# Patient Record
Sex: Male | Born: 1980 | Race: White | Hispanic: No | Marital: Single | Smoking: Current every day smoker
Health system: Southern US, Community
[De-identification: ages and names within clinical notes are randomized; demographics above are authoritative.]

## PROBLEM LIST (undated history)

## (undated) DIAGNOSIS — L02519 Cutaneous abscess of unspecified hand: Secondary | ICD-10-CM

## (undated) DIAGNOSIS — F419 Anxiety disorder, unspecified: Secondary | ICD-10-CM

## (undated) DIAGNOSIS — L03119 Cellulitis of unspecified part of limb: Secondary | ICD-10-CM

## (undated) DIAGNOSIS — S0990XA Unspecified injury of head, initial encounter: Secondary | ICD-10-CM

## (undated) DIAGNOSIS — F209 Schizophrenia, unspecified: Secondary | ICD-10-CM

## (undated) HISTORY — PX: ADENOIDECTOMY: SUR15

## (undated) HISTORY — PX: OTHER SURGICAL HISTORY: SHX169

## (undated) HISTORY — PX: TYMPANOSTOMY TUBE PLACEMENT: SHX32

---

## 2002-06-20 ENCOUNTER — Encounter: Payer: Self-pay | Admitting: Emergency Medicine

## 2002-06-20 ENCOUNTER — Inpatient Hospital Stay (HOSPITAL_COMMUNITY): Admission: EM | Admit: 2002-06-20 | Discharge: 2002-06-21 | Payer: Self-pay | Admitting: Neurosurgery

## 2002-09-27 ENCOUNTER — Emergency Department (HOSPITAL_COMMUNITY): Admission: EM | Admit: 2002-09-27 | Discharge: 2002-09-28 | Payer: Self-pay | Admitting: Emergency Medicine

## 2003-03-14 ENCOUNTER — Emergency Department (HOSPITAL_COMMUNITY): Admission: EM | Admit: 2003-03-14 | Discharge: 2003-03-14 | Payer: Self-pay | Admitting: Emergency Medicine

## 2003-12-28 ENCOUNTER — Inpatient Hospital Stay (HOSPITAL_COMMUNITY): Admission: EM | Admit: 2003-12-28 | Discharge: 2004-01-04 | Payer: Self-pay | Admitting: Psychiatry

## 2003-12-30 ENCOUNTER — Encounter: Payer: Self-pay | Admitting: Psychiatry

## 2004-01-29 ENCOUNTER — Emergency Department (HOSPITAL_COMMUNITY): Admission: EM | Admit: 2004-01-29 | Discharge: 2004-01-29 | Payer: Self-pay | Admitting: Emergency Medicine

## 2004-10-02 ENCOUNTER — Emergency Department (HOSPITAL_COMMUNITY): Admission: EM | Admit: 2004-10-02 | Discharge: 2004-10-03 | Payer: Self-pay | Admitting: Emergency Medicine

## 2007-06-05 ENCOUNTER — Emergency Department (HOSPITAL_COMMUNITY): Admission: EM | Admit: 2007-06-05 | Discharge: 2007-06-05 | Payer: Self-pay | Admitting: Emergency Medicine

## 2009-10-01 ENCOUNTER — Emergency Department (HOSPITAL_COMMUNITY): Admission: EM | Admit: 2009-10-01 | Discharge: 2009-10-01 | Payer: Self-pay | Admitting: Emergency Medicine

## 2009-10-05 ENCOUNTER — Emergency Department (HOSPITAL_COMMUNITY): Admission: EM | Admit: 2009-10-05 | Discharge: 2009-10-05 | Payer: Self-pay | Admitting: Emergency Medicine

## 2011-01-15 LAB — RAPID STREP SCREEN (MED CTR MEBANE ONLY): Streptococcus, Group A Screen (Direct): NEGATIVE
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
## 2011-03-02 NOTE — Discharge Summary (Signed)
NAME:  Troy [name], Troy [name]                           ACCOUNT NO.:  [id number]   MEDICAL RECORD NO.:  [id number]                   PATIENT TYPE:  IPS   LOCATION:  0405                                 FACILITY:  [facility]   PHYSICIAN:  Jeanice Lim, M.D.              DATE OF BIRTH:  19-Oct-1980   DATE OF ADMISSION:  12/28/2003  DATE OF DISCHARGE:  01/04/2004                                 DISCHARGE SUMMARY   IDENTIFYING DATA:  This is a 30 year old Caucasian male, single,  involuntarily admitted with history of taking an ecstasy pill and had  command auditory hallucinations telling him to eat his feces, which he  followed.  He felt the feces or fecal matter, due to being orangish and  clear, was still a pure form of ecstasy and therefore he should eat it to  continue his euphoria.  The patient drinks beer for calories because he is  fasting, feeling that he should not eat.  Quite delusional at the time of  admission.  Long history of psychotic disorder as well as substance abuse  history.   MEDICATIONS:  Risperdal, Consta 37.5 mg (last dose on December 21, 2003).   ALLERGIES:  No known drug allergies.   PHYSICAL EXAMINATION:  Within normal limits.  Neurologically nonfocal.   LABORATORY DATA:  Routine admission labs within normal limits.   MENTAL STATUS EXAM:  Fully alert with stocking cap on head, pulled low over  eyes.  Sweatsuit.  Speech mumbling.  Difficult to understand the patient,  garbled, guarded, withdrawn, isolated, paranoid, distorted thinking,  decreased volition.  Positive suicidal ideation without plan.  Cognitively  intact.  Judgment and insight are poor.  The patient reported he is fasting.  Only drinking milk, lots of tea, coffee and beer.   ADMISSION DIAGNOSES:   AXIS I:  1. Schizophrenia, undifferentiated-type.  2. Polysubstance abuse.  3. Rule out substance-induced psychotic disorder.   AXIS II:  Deferred.   AXIS III:  None.   AXIS IV:  Moderate (problems with  primary support group).   AXIS V:  30/55.   HOSPITAL COURSE:  The patient was admitted and ordered routine p.r.n.  medications and underwent further monitoring.  Was encouraged to participate  in individual, group and milieu therapy.  The patient was resumed on  psychotropics and Risperdal Consta was ordered.  The next shot would have  been due due to severity of psychosis.  The patient described CD symptoms as  well as egosyntonic and egodystonic psychotic symptoms consistent with  schizophrenia.  The patient was optimized on p.o. Risperdal .  The patient  had scattered thoughts and some delusional thinking and some out-of-  __________ ideas.  No EPS or TD was noted.  The patient was optimized on  Depakote and Risperdal and showed clear improvement with a decrease in overt  psychotic symptoms.  Stabilization of mood.  No dangerous ideation.  Good  reality testing as far as interacting with environment.   CONDITION ON DISCHARGE:  The patient was discharged with no risk issues  after medication education.   DISCHARGE MEDICATIONS:  1. Cogentin 1 mg b.i.d.  2. Risperdal 1/2 q.a.m., 1/2 at 4 p.m., 2 q.h.s.  3. Depakote 500 mg q.h.s.  4. Trazodone 100 mg p.r.n. insomnia.  5. Risperdal Consta 50 mg every two weeks.  The patient had Risperdal Consta     on January 04, 2004 at 10:30 a.m.  Next due is April 2005.   FOLLOW UP:  The patient was to follow up with [facility] for intake within five days.   DISCHARGE DIAGNOSES:   AXIS I:  1. Schizophrenia, undifferentiated-type.  2. Polysubstance abuse.  3. Rule out substance-induced psychotic disorder.   AXIS II:  Deferred.   AXIS III:  None.   AXIS IV:  Moderate (problems with primary support group).   AXIS V:  Global Assessment of Functioning on discharge 50-55.                                               Jeanice Lim, M.D.    JEM/MEDQ  D:  02/22/2004  T:  02/23/2004  Job:  [id number]

## 2012-03-05 ENCOUNTER — Encounter (HOSPITAL_COMMUNITY): Payer: Self-pay | Admitting: *Deleted

## 2012-03-05 ENCOUNTER — Emergency Department (HOSPITAL_COMMUNITY): Payer: Medicaid Other

## 2012-03-05 ENCOUNTER — Emergency Department (HOSPITAL_COMMUNITY)
Admission: EM | Admit: 2012-03-05 | Discharge: 2012-03-05 | Disposition: A | Discharge status: Home / Self Care | Payer: Medicaid Other | Attending: Emergency Medicine | Admitting: Emergency Medicine

## 2012-03-05 DIAGNOSIS — M25519 Pain in unspecified shoulder: Secondary | ICD-10-CM | POA: Insufficient documentation

## 2012-03-05 DIAGNOSIS — R079 Chest pain, unspecified: Secondary | ICD-10-CM | POA: Insufficient documentation

## 2012-03-05 DIAGNOSIS — Z8659 Personal history of other mental and behavioral disorders: Secondary | ICD-10-CM | POA: Insufficient documentation

## 2012-03-05 DIAGNOSIS — R1011 Right upper quadrant pain: Secondary | ICD-10-CM | POA: Insufficient documentation

## 2012-03-05 DIAGNOSIS — H5789 Other specified disorders of eye and adnexa: Secondary | ICD-10-CM | POA: Insufficient documentation

## 2012-03-05 DIAGNOSIS — S298XXA Other specified injuries of thorax, initial encounter: Secondary | ICD-10-CM

## 2012-03-05 DIAGNOSIS — R209 Unspecified disturbances of skin sensation: Secondary | ICD-10-CM | POA: Insufficient documentation

## 2012-03-05 DIAGNOSIS — S0510XA Contusion of eyeball and orbital tissues, unspecified eye, initial encounter: Secondary | ICD-10-CM | POA: Insufficient documentation

## 2012-03-05 DIAGNOSIS — R51 Headache: Secondary | ICD-10-CM | POA: Insufficient documentation

## 2012-03-05 DIAGNOSIS — R42 Dizziness and giddiness: Secondary | ICD-10-CM | POA: Insufficient documentation

## 2012-03-05 DIAGNOSIS — S0180XA Unspecified open wound of other part of head, initial encounter: Secondary | ICD-10-CM | POA: Insufficient documentation

## 2012-03-05 DIAGNOSIS — S060X0A Concussion without loss of consciousness, initial encounter: Secondary | ICD-10-CM | POA: Insufficient documentation

## 2012-03-05 HISTORY — DX: Schizophrenia, unspecified: F20.9

## 2012-03-05 LAB — URINALYSIS, ROUTINE W REFLEX MICROSCOPIC
Glucose, UA: NEGATIVE mg/dL
Hgb urine dipstick: NEGATIVE
Ketones, ur: 15 mg/dL — AB
Leukocytes, UA: NEGATIVE
Protein, ur: NEGATIVE mg/dL
Urobilinogen, UA: 0.2 mg/dL (ref 0.0–1.0)

## 2012-03-05 LAB — CBC
HCT: 40.9 % (ref 39.0–52.0)
Hemoglobin: 14.5 g/dL (ref 13.0–17.0)
RBC: 4.53 MIL/uL (ref 4.22–5.81)
WBC: 7.6 10*3/uL (ref 4.0–10.5)

## 2012-03-05 LAB — DIFFERENTIAL
Lymphocytes Relative: 22 % (ref 12–46)
Lymphs Abs: 1.7 10*3/uL (ref 0.7–4.0)
Monocytes Absolute: 0.9 10*3/uL (ref 0.1–1.0)
Monocytes Relative: 11 % (ref 3–12)
Neutro Abs: 5 10*3/uL (ref 1.7–7.7)
Neutrophils Relative %: 66 % (ref 43–77)

## 2012-03-05 LAB — COMPREHENSIVE METABOLIC PANEL
Alkaline Phosphatase: 45 U/L (ref 39–117)
BUN: 9 mg/dL (ref 6–23)
CO2: 25 mEq/L (ref 19–32)
Chloride: 104 mEq/L (ref 96–112)
Creatinine, Ser: 0.76 mg/dL (ref 0.50–1.35)
GFR calc non Af Amer: >90 mL/min (ref >90)
Potassium: 3.4 mEq/L — ABNORMAL LOW (ref 3.5–5.1)
Total Bilirubin: 1.2 mg/dL (ref 0.3–1.2)

## 2012-03-05 MED ORDER — SODIUM CHLORIDE 0.9 % IV SOLN
INTRAVENOUS | Status: DC
  Administered 2012-03-05: 17:00:00 via INTRAVENOUS

## 2012-03-05 MED ORDER — FENTANYL CITRATE 0.05 MG/ML IJ SOLN
INTRAMUSCULAR | Status: AC
  Filled 2012-03-05: qty 2

## 2012-03-05 MED ORDER — OXYCODONE-ACETAMINOPHEN 5-325 MG PO TABS: 1.0000 | ORAL_TABLET | Freq: Four times a day (QID) | ORAL | Status: AC | PRN

## 2012-03-05 MED ORDER — IOHEXOL 300 MG/ML  SOLN
100.0000 mL | Freq: Once | INTRAMUSCULAR | Status: AC | PRN
  Administered 2012-03-05: 80 mL via INTRAVENOUS

## 2012-03-05 MED ORDER — SODIUM CHLORIDE 0.9 % IV BOLUS (SEPSIS)
500.0000 mL | Freq: Once | INTRAVENOUS | Status: AC
  Administered 2012-03-05: 500 mL via INTRAVENOUS

## 2012-03-05 MED ORDER — FENTANYL CITRATE 0.05 MG/ML IJ SOLN
50.0000 ug | Freq: Once | INTRAMUSCULAR | Status: AC
  Administered 2012-03-05: 50 ug via INTRAVENOUS

## 2012-03-05 NOTE — ED Notes (Signed)
MD at bedside. 
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
## 2012-03-05 NOTE — ED Notes (Signed)
Family at bedside. 

## 2012-03-05 NOTE — ED Notes (Signed)
Vital signs stable. 

## 2012-03-05 NOTE — ED Notes (Signed)
Patient is resting comfortably. 

## 2012-03-05 NOTE — Discharge Instructions (Signed)
Concussion and Brain Injury A blow or jolt to the head can disrupt the normal function of the brain. This type of brain injury is often called a "concussion" or a "closed head injury." Concussions are usually not life-threatening. Even so, the effects of a concussion can be serious.  CAUSES  A concussion is caused by a blunt blow to the head. The blow might be direct or indirect as described below.  Direct blow (running into another player during a soccer game, being hit in a fight, or hitting your head on a hard surface).   Indirect blow (when your head moves rapidly and violently back and forth like in a car crash).  SYMPTOMS  The brain is very complex. Every head injury is different. Some symptoms may appear right away. Other symptoms may not show up for days or weeks after the concussion. The signs of concussion can be hard to notice. Early on, problems may be missed by patients, family members, and caregivers. You may look fine even though you are acting or feeling differently.  These symptoms are usually temporary, but may last for days, weeks, or even longer. Symptoms include:  Mild headaches that will not go away.   Having more trouble than usual with:   Remembering things.   Paying attention or concentrating.   Organizing daily tasks.   Making decisions and solving problems.   Slowness in thinking, acting, speaking, or reading.   Getting lost or easily confused.   Feeling tired all the time or lacking energy (fatigue).   Feeling drowsy.   Sleep disturbances.   Sleeping more than usual.   Sleeping less than usual.   Trouble falling asleep.   Trouble sleeping (insomnia).   Loss of balance or feeling lightheaded or dizzy.   Nausea or vomiting.   Numbness or tingling.   Increased sensitivity to:   Sounds.   Lights.   Distractions.  Other symptoms might include:  Vision problems or eyes that tire easily.   Diminished sense of taste or smell.   Ringing  in the ears.   Mood changes such as feeling sad, anxious, or listless.   Becoming easily irritated or angry for little or no reason.   Lack of motivation.  DIAGNOSIS  Your caregiver can usually diagnose a concussion or mild brain injury based on your description of your injury and your symptoms.  Your evaluation might include:  A brain scan to look for signs of injury to the brain. Even if the test shows no injury, you may still have a concussion.   Blood tests to be sure other problems are not present.  TREATMENT   People with a concussion need to be examined and evaluated. Most people with concussions are treated in an emergency department, urgent care, or clinic. Some people must stay in the hospital overnight for further treatment.   Your caregiver will send you home with important instructions to follow. Be sure to carefully follow them.   Tell your caregiver if you are already taking any medicines (prescription, over-the-counter, or natural remedies), or if you are drinking alcohol or taking illegal drugs. Also, talk with your caregiver if you are taking blood thinners (anticoagulants) or aspirin. These drugs may increase your chances of complications. All of this is important information that may affect treatment.   Only take over-the-counter or prescription medicines for pain, discomfort, or fever as directed by your caregiver.  PROGNOSIS  How fast people recover from brain injury varies from person to person.   Although most people have a good recovery, how quickly they improve depends on many factors. These factors include how severe their concussion was, what part of the brain was injured, their age, and how healthy they were before the concussion.  Because all head injuries are different, so is recovery. Most people with mild injuries recover fully. Recovery can take time. In general, recovery is slower in older persons. Also, persons who have had a concussion in the past or have  other medical problems may find that it takes longer to recover from their current injury. Anxiety and depression may also make it harder to adjust to the symptoms of brain injury. HOME CARE INSTRUCTIONS  Return to your normal activities slowly, not all at once. You must give your body and brain enough time for recovery.  Get plenty of sleep at night, and rest during the day. Rest helps the brain to heal.   Avoid staying up late at night.   Keep the same bedtime hours on weekends and weekdays.   Take daytime naps or rest breaks when you feel tired.   Limit activities that require a lot of thought or concentration (brain or cognitive rest). This includes:   Homework or job-related work.   Watching TV.   Computer work.   Avoid activities that could lead to a second brain injury, such as contact or recreational sports, until your caregiver says it is okay. Even after your brain injury has healed, you should protect yourself from having another concussion.   Ask your caregiver when you can return to your normal activities such as driving, bicycling, or operating heavy equipment. Your ability to react may be slower after a brain injury.   Talk with your caregiver about when you can return to work or school.   Inform your teachers, school nurse, school counselor, coach, Product/process development scientist, or work Freight forwarder about your injury, symptoms, and restrictions. They should be instructed to report:   Increased problems with attention or concentration.   Increased problems remembering or learning new information.   Increased time needed to complete tasks or assignments.   Increased irritability or decreased ability to cope with stress.   Increased symptoms.   Take only those medicines that your caregiver has approved.   Do not drink alcohol until your caregiver says you are well enough to do so. Alcohol and certain other drugs may slow your recovery and can put you at risk of further injury.    If it is harder than usual to remember things, write them down.   If you are easily distracted, try to do one thing at a time. For example, do not try to watch TV while fixing dinner.   Talk with family members or close friends when making important decisions.   Keep all follow-up appointments. Repeated evaluation of your symptoms is recommended for your recovery.  PREVENTION  Protect your head from future injury. It is very important to avoid another head or brain injury before you have recovered. In rare cases, another injury has lead to permanent brain damage, brain swelling, or death. Avoid injuries by using:  Seatbelts when riding in a car.   Alcohol only in moderation.   A helmet when biking, skiing, skateboarding, skating, or doing similar activities.   Safety measures in your home.   Remove clutter and tripping hazards from floors and stairways.   Use grab bars in bathrooms and handrails by stairs.   Place non-slip mats on floors and in bathtubs.  Improve lighting in dim areas.  SEEK MEDICAL CARE IF:  A head injury can cause lingering symptoms. You should seek medical care if you have any of the following symptoms for more than 3 weeks after your injury or are planning to return to sports:  Chronic headaches.   Dizziness or balance problems.   Nausea.   Vision problems.   Increased sensitivity to noise or light.   Depression or mood swings.   Anxiety or irritability.   Memory problems.   Difficulty concentrating or paying attention.   Sleep problems.   Feeling tired all the time.  SEEK IMMEDIATE MEDICAL CARE IF:  You have had a blow or jolt to the head and you (or your family or friends) notice:  Severe or worsening headaches.   Weakness (even if only in one hand or one leg or one part of the face), numbness, or decreased coordination.   Repeated vomiting.   Increased sleepiness or passing out.   One black center of the eye (pupil) is larger  than the other.   Convulsions (seizures).   Slurred speech.   Increasing confusion, restlessness, agitation, or irritability.   Lack of ability to recognize people or places.   Neck pain.   Difficulty being awakened.   Unusual behavior changes.   Loss of consciousness.  Older adults with a brain injury may have a higher risk of serious complications such as a blood clot on the brain. Headaches that get worse or an increase in confusion are signs of this complication. If these signs occur, see a caregiver right away. MAKE SURE YOU:   Understand these instructions.   Will watch your condition.   Will get help right away if you are not doing well or get worse.  FOR MORE INFORMATION  Several groups help people with brain injury and their families. They provide information and put people in touch with local resources. These include support groups, rehabilitation services, and a variety of health care professionals. Among these groups, the Brain Injury Association (BIA, www.biausa.org) has a Secretary/administrator that gathers scientific and educational information and works on a national level to help people with brain injury.  Document Released: 12/22/2003 Document Revised: 09/20/2011 Document Reviewed: 05/19/2008 [facility] Patient Information 2012 [place], [place].Blunt Chest Trauma Blunt chest trauma is an injury caused by a blow to the chest. These chest injuries can be very painful. Blunt chest trauma often results in bruised or broken (fractured) ribs. Most cases of bruised and fractured ribs from blunt chest traumas get better after 1 to 3 weeks of rest and pain medicine. Often, the soft tissue in the chest wall is also injured, causing pain and bruising. Internal organs, such as the heart and lungs, may also be injured. Blunt chest trauma can lead to serious medical problems. This injury requires immediate medical care. CAUSES   Motor vehicle collisions.   Falls.   Physical violence.    Sports injuries.  SYMPTOMS   Chest pain. The pain may be worse when you move or breathe deeply.   Shortness of breath.   Lightheadedness.   Bruising.   Tenderness.   Swelling.  DIAGNOSIS  Your caregiver will do a physical exam. X-rays may be taken to look for fractures. However, minor rib fractures may not show up on X-rays until a few days after the injury. If a more serious injury is suspected, further imaging tests may be done. This may include ultrasounds, computed tomography (CT) scans, or magnetic resonance imaging (MRI). TREATMENT  Treatment depends on the severity of your injury. Your caregiver may prescribe pain medicines and deep breathing exercises. HOME CARE INSTRUCTIONS  Limit your activities until you can move around without much pain.   Do not do any strenuous work until your injury is healed.   Put ice on the injured area.   Put ice in a plastic bag.   Place a towel between your skin and the bag.   Leave the ice on for 15 to 20 minutes, 3 to 4 times a day.   You may wear a rib belt as directed by your caregiver to reduce pain.   Practice deep breathing as directed by your caregiver to keep your lungs clear.   Only take over-the-counter or prescription medicines for pain, fever, or discomfort as directed by your caregiver.  SEEK IMMEDIATE MEDICAL CARE IF:   You have increasing pain or shortness of breath.   You cough up blood.   You have nausea, vomiting, or abdominal pain.   You have a fever.   You feel dizzy, weak, or you faint.  MAKE SURE YOU:  Understand these instructions.   Will watch your condition.   Will get help right away if you are not doing well or get worse.  Document Released: 11/08/2004 Document Revised: 09/20/2011 Document Reviewed: 07/18/2011 [facility] Patient Information 2012 [place], [place].

## 2012-03-05 NOTE — ED Notes (Signed)
Patient transported to CT 
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
## 2012-03-05 NOTE — Progress Notes (Signed)
Patient discharged to home with mother at bedside.  Pain controlled and tolerable at 6/10.  Patient ambulatory out of ED.  Discussed discharge instructions and medications.  Verbalized understanding.  No other concerns at this time.  Barrie Lyme 7:38 PM 03/05/2012

## 2012-03-05 NOTE — ED Notes (Signed)
Patient transported to X-ray 

## 2012-03-26 ENCOUNTER — Emergency Department (HOSPITAL_COMMUNITY)
Admission: EM | Admit: 2012-03-26 | Discharge: 2012-03-26 | Disposition: A | Discharge status: Home / Self Care | Payer: Medicaid Other | Attending: Emergency Medicine | Admitting: Emergency Medicine

## 2012-03-26 DIAGNOSIS — F411 Generalized anxiety disorder: Secondary | ICD-10-CM | POA: Insufficient documentation

## 2012-03-26 DIAGNOSIS — S060XAA Concussion with loss of consciousness status unknown, initial encounter: Secondary | ICD-10-CM | POA: Insufficient documentation

## 2012-03-26 DIAGNOSIS — F209 Schizophrenia, unspecified: Secondary | ICD-10-CM | POA: Insufficient documentation

## 2012-03-26 DIAGNOSIS — F419 Anxiety disorder, unspecified: Secondary | ICD-10-CM

## 2012-03-26 DIAGNOSIS — F17200 Nicotine dependence, unspecified, uncomplicated: Secondary | ICD-10-CM | POA: Insufficient documentation

## 2012-03-26 DIAGNOSIS — S060X9A Concussion with loss of consciousness of unspecified duration, initial encounter: Secondary | ICD-10-CM

## 2012-03-26 MED ORDER — LORAZEPAM 1 MG PO TABS
2.0000 mg | ORAL_TABLET | Freq: Once | ORAL | Status: AC
  Administered 2012-03-26: 2 mg via ORAL
  Filled 2012-03-26: qty 2

## 2012-03-26 MED ORDER — LORAZEPAM 1 MG PO TABS: 1.0000 mg | ORAL_TABLET | Freq: Three times a day (TID) | ORAL | Status: AC | PRN
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
## 2012-03-26 NOTE — Discharge Instructions (Signed)
Your exam does not show any signs of a major head or body trauma. Take ativan as prescribed for anxiety. Contact psychiatrists from numbers provided to schedule a follow up appointment as soon as able to get some therapy or medications. Follow up with primary care doctor as well. See resource guide below.   Anxiety and Panic Attacks Your caregiver has informed you that you are having an anxiety or panic attack. There may be many forms of this. Most of the time these attacks come suddenly and without warning. They come at any time of day, including periods of sleep, and at any time of life. They may be [place] and unexplained. Although panic attacks are very scary, they are physically harmless. Sometimes the cause of your anxiety is not known. Anxiety is a protective mechanism of the body in its fight or flight mechanism. Most of these perceived danger situations are actually nonphysical situations (such as anxiety over losing a job). CAUSES  The causes of an anxiety or panic attack are many. Panic attacks may occur in otherwise healthy people given a certain set of circumstances. There may be a genetic cause for panic attacks. Some medications may also have anxiety as a side effect. SYMPTOMS  Some of the most common feelings are:  Intense terror.   Dizziness, feeling faint.   Hot and cold flashes.   Fear of going crazy.   Feelings that nothing is real.   Sweating.   Shaking.   Chest pain or a fast heartbeat (palpitations).   Smothering, choking sensations.   Feelings of impending doom and that death is near.   Tingling of extremities, this may be from over-breathing.   Altered reality (derealization).   Being detached from yourself (depersonalization).  Several symptoms can be present to make up anxiety or panic attacks. DIAGNOSIS  The evaluation by your caregiver will depend on the type of symptoms you are experiencing. The diagnosis of anxiety or panic attack is made when no  physical illness can be determined to be a cause of the symptoms. TREATMENT  Treatment to prevent anxiety and panic attacks may include:  Avoidance of circumstances that cause anxiety.   Reassurance and relaxation.   Regular exercise.   Relaxation therapies, such as yoga.   Psychotherapy with a psychiatrist or therapist.   Avoidance of caffeine, alcohol and illegal drugs.   Prescribed medication.  SEEK IMMEDIATE MEDICAL CARE IF:   You experience panic attack symptoms that are different than your usual symptoms.   You have any worsening or concerning symptoms.  Document Released: 10/01/2005 Document Revised: 09/20/2011 Document Reviewed: 02/02/2010 [facility] Patient Information 2012 [place], [place].  RESOURCE GUIDE  Chronic Pain Problems: Contact [facility]  [phone] Patients need to be referred by their primary care doctor.  Insufficient Money for Medicine: Contact United Way:  call "211" or [facility] [phone].  No Primary Care Doctor: - Call [facility]  [phone] - can help you locate a primary care doctor that  accepts your insurance, provides certain services, etc. - Physician Referral [phone]  Agencies that provide inexpensive medical care: - [facility]  [phone] - [facility]  [phone] - [facility]  [phone] [facility]  [phone] - Planned Parenthood  [phone] [facility]  [phone]  Medicaid-accepting [facility] Providers: - [facility]- [address]  [phone], Mon-Fri 9am-7pm, Sat 9am-1pm [phone]  [address] [place], [address] [place]  [phone] - [facility]- [address] [place]  [phone] [facility]- [address]  [phone] - Renaye Rakers- [address] [place], [address], [phone]  Only accepts [place] patients  after they have their name  applied to their card  Self Pay (no insurance) in [facility]: - Sickle Cell Patients: Dr Willey Blade, [facility]  [address] [place], [phone] - [facility]- [address] [place]  [phone]       [facility] [place]- [address]       -     [facility]- see information above (Speak to [employer] if you do not have insurance)       -  [facility]- [address] [place], [phone]       -  [facility]- [address],  [phone]       -  [facility]- [address], [phone]       -  Dr Julio Sicks-  [address], [place], [phone]       -  [facility]- [address] [phone]       -  [facility]- [address] [phone], also [address] [phone]       -    [facility]- [address] [place], [phone], 1st & 3rd Saturday   every month, 10am-1pm  1) Find a Doctor and Pay Out of Pocket Although you won't have to find out who is covered by your insurance plan, it is a good idea to ask around and get recommendations. You will then need to call the office and see if the doctor you have chosen will accept you as a new patient and what types of options they offer for patients who are self-pay. Some doctors offer discounts or will set up payment plans for their patients who do not have insurance, but you will need to ask so you aren't surprised when you get to your appointment.  2) Contact Your Local Health Department Not all health departments have doctors that can see patients for sick visits, but many do, so it is worth a call to see if yours does. If you don't know where your local health department is, you can check in your phone book. The CDC also has a tool to help you locate your state's health department, and many state websites also have listings of all of their local health departments.  3) Find  a Walk-in Clinic If your illness is not likely to be very severe or complicated, you may want to try a walk in clinic. These are popping up all over the country in pharmacies, drugstores, and shopping centers. They're usually staffed by nurse practitioners or physician assistants that have been trained to treat common illnesses and complaints. They're usually fairly quick and inexpensive. However, if you have serious medical issues or chronic medical problems, these are probably not your best option  STD Testing - [facility] Department of [facility] [place], STD Clinic, [address], [place], phone [phone] or [phone].  Monday - Friday, call for an appointment. [facility] of [employer], STD Clinic, [address], [place], phone [phone] or [phone].  Monday - Friday,  call for an appointment.  Abuse/Neglect: [facility] Child Abuse Hotline 438-298-5955 [facility] Child Abuse Hotline 605-205-5188 (After Hours)  Emergency Shelter:  [facility] 307-864-5732  Maternity Homes: - [facility] (623)057-4806 - [facility] [phone]  MRSA Hotline #:   435-290-0565  [facility] Resources  [facility] [place]  United Way [facility]. [address]                 [address]         [address] [place] [address]  Blondell Reveal Phone:  [phone]                                  Phone:  [phone]                   Phone:  [phone]  [facility], [phone] - [phone]       -     [facility] in [place], [address]                                  [phone], [facility] Child Abuse Hotline (480)152-5441 or 731-245-9373 (After Hours)   Behavioral Health Services  Substance Abuse Resources: - [facility] and Drug Services  ([phone] - [facility] [phone] - [facility] [phone] Floydene Flock [phone] - Residential & Outpatient Substance Abuse Program  [phone]  Psychological Services: [facility]  [phone] Services  [phone] - [facility], [phone] [place]. [address] [place], ACCESS LINE: [phone] or ([phone], [employer]  Dental Assistance  If unable to pay or uninsured, contact:  [facility] or [facility]. to become qualified for the adult dental clinic.  Patients with Medicaid: [facility] [phone] [address], [phone] [address] [phone]  If unable to pay, or uninsured, contact [facility] (978)521-3401) or [facility] Department [phone] in [place], [phone] in [facility]) to become qualified for the adult dental clinic  Other Low-Cost Community Dental Services: - [facility]- [address] [place], [place], [place], [address], [phone], [address]. [phone], 2nd and 4th Thursday of the month at 6:30am.  10 clients each day by appointment, can sometimes see walk-in patients if someone does not show for an appointment. [facility]- [address] [place], [place], [address], [phone] - [facility]- [address], [place], [place], [address], [phone] - [place] [facility]- [phone] [facility]- [phone] [facility] Department- [phone]

## 2012-03-28 NOTE — ED Provider Notes (Signed)
Medical screening examination/treatment/procedure(s) were performed by non-physician practitioner and as supervising physician I was immediately available for consultation/collaboration.  Flint Melter, MD 03/28/12 1007

## 2012-04-13 ENCOUNTER — Emergency Department (HOSPITAL_COMMUNITY): Payer: Medicaid Other

## 2012-04-13 ENCOUNTER — Encounter (HOSPITAL_COMMUNITY): Payer: Self-pay | Admitting: Emergency Medicine

## 2012-04-13 ENCOUNTER — Emergency Department (HOSPITAL_COMMUNITY)
Admission: EM | Admit: 2012-04-13 | Discharge: 2012-04-13 | Disposition: A | Discharge status: Home / Self Care | Payer: Medicaid Other | Attending: Emergency Medicine | Admitting: Emergency Medicine

## 2012-04-13 DIAGNOSIS — Z8659 Personal history of other mental and behavioral disorders: Secondary | ICD-10-CM | POA: Insufficient documentation

## 2012-04-13 DIAGNOSIS — F17200 Nicotine dependence, unspecified, uncomplicated: Secondary | ICD-10-CM | POA: Insufficient documentation

## 2012-04-13 DIAGNOSIS — M898X5 Other specified disorders of bone, thigh: Secondary | ICD-10-CM

## 2012-04-13 DIAGNOSIS — F411 Generalized anxiety disorder: Secondary | ICD-10-CM | POA: Insufficient documentation

## 2012-04-13 DIAGNOSIS — M25559 Pain in unspecified hip: Secondary | ICD-10-CM | POA: Insufficient documentation

## 2012-04-13 HISTORY — DX: Anxiety disorder, unspecified: F41.9

## 2012-04-13 MED ORDER — IBUPROFEN 800 MG PO TABS
800.0000 mg | ORAL_TABLET | Freq: Once | ORAL | Status: AC
  Administered 2012-04-13: 800 mg via ORAL
  Filled 2012-04-13: qty 1

## 2012-04-13 MED ORDER — TRAMADOL HCL 50 MG PO TABS: 50.0000 mg | ORAL_TABLET | Freq: Four times a day (QID) | ORAL | Status: AC | PRN

## 2012-04-13 NOTE — ED Provider Notes (Signed)
History     CSN: [id number]  Arrival date & time 04/13/12  1618   First MD Initiated Contact with Patient 04/13/12 1635      Chief Complaint  Patient presents with  . Leg Pain    (Consider location/radiation/quality/duration/timing/severity/associated sxs/prior treatment) HPI  Pt to the ED with complaints of leg pain. She fell injuring his thigh bone. He says that he is having very bad pain but that it has gotten a lot better in the last 30 minutes. Pt denies having pain anywhere else. He does not want to describe how he fell in detail. Pt does have psychiatric history. Pt in nAD and VSS  Past Medical History  Diagnosis Date  . Schizophrenia   . Anxiety     History reviewed. No pertinent past surgical history.  History reviewed. No pertinent family history.  History  Substance Use Topics  . Smoking status: Current Everyday Smoker -- 0.5 packs/day    Types: Cigarettes  . Smokeless tobacco: Not on file  . Alcohol Use: Yes     occasionally, 1 drink today      Review of Systems   HEENT: denies blurry vision or change in hearing PULMONARY: Denies difficulty breathing and SOB CARDIAC: denies chest pain or heart palpitations MUSCULOSKELETAL:  denies being unable to ambulate ABDOMEN AL: denies abdominal pain GU: denies loss of bowel or urinary control NEURO: denies numbness and tingling in extremities SKIN: no new rashes PSYCH: patient denies anxiety or depression. NECK: Pt denies having neck pain    Allergies  Review of patient's allergies indicates no known allergies.  Home Medications   Current Outpatient Rx  Name Route Sig Dispense Refill  . LORAZEPAM 1 MG PO TABS Oral Take 1 mg by mouth every 8 (eight) hours as needed. anxiety    . TRAMADOL HCL 50 MG PO TABS Oral Take 1 tablet (50 mg total) by mouth every 6 (six) hours as needed for pain. 8 tablet 0    BP 126/80  Pulse 93  Temp 98.6 F (37 C) (Oral)  Resp 18  SpO2 99%  Physical Exam  Nursing  note and vitals reviewed. Constitutional: He appears well-developed and well-nourished. No distress.  HENT:  Head: Normocephalic and atraumatic.  Eyes: Pupils are equal, round, and reactive to light.  Neck: Normal range of motion. Neck supple.  Cardiovascular: Normal rate and regular rhythm.   Pulmonary/Chest: Effort normal.  Abdominal: Soft.  Musculoskeletal:       Right upper leg: He exhibits tenderness. He exhibits no bony tenderness, no swelling, no edema, no deformity and no laceration.  Neurological: He is alert.  Skin: Skin is warm and dry.    ED Course  Procedures (including critical care time)  Labs Reviewed - No data to display Dg Femur Right  04/13/2012  *RADIOLOGY REPORT*  Clinical Data: Leg pain  RIGHT FEMUR - 2 VIEW  Comparison: None  Findings: There is no evidence of fracture or dislocation.  There is no evidence of arthropathy or other focal bone abnormality. Soft tissues are unremarkable.  IMPRESSION: Negative exam.  Original Report Authenticated By: Rosealee Albee, M.D.   Dg Knee Complete 4 Views Right  04/13/2012  *RADIOLOGY REPORT*  Clinical Data: Leg pain.  RIGHT KNEE - COMPLETE 4+ VIEW  Comparison: No priors.  Findings: Five views of the right knee demonstrate no definite acute displaced fracture, subluxation or dislocation.  However, the patellar tendon is thickened, particularly inferior.  Along the course of the inferior aspect of  the patellar tendon there are several ossific densities which appear well corticated.  The overall appearance is most consistent with sequela of prior Osgood- Schlatter disease.  IMPRESSION: 1.  No acute radiographic abnormality of the right knee. 2.  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  Original Report Authenticated By: Florencia Reasons, M.D.     1. Pain in femur       MDM  Due to mechanism of injury, i do  not suspect DVT. No bruising or lacerations noted. Pt able to ambulate.   Pt has been advised of the symptoms that warrant their return to the ED. Patient has voiced understanding and has agreed to follow-up with the PCP or specialist.        Dorthula Matas, PA 04/28/12 1750

## 2012-04-13 NOTE — ED Notes (Signed)
States feet slipped out from under him, twisted right leg.

## 2012-04-13 NOTE — ED Notes (Signed)
Patient states that she fell and hurt his thigh "down to the bone". - the patient states that his right knee hurts as well. The patient is rocking in the chair "I can't handle this extreme pain"

## 2012-04-13 NOTE — Discharge Instructions (Signed)
Arthralgia Your caregiver has diagnosed you as suffering from an arthralgia. Arthralgia means there is pain in a joint. This can come from many reasons including:  Bruising the joint which causes soreness (inflammation) in the joint.   Wear and tear on the joints which occur as we grow older (osteoarthritis).   Overusing the joint.   Various forms of arthritis.   Infections of the joint.  Regardless of the cause of pain in your joint, most of these different pains respond to anti-inflammatory drugs and rest. The exception to this is when a joint is infected, and these cases are treated with antibiotics, if it is a bacterial infection. HOME CARE INSTRUCTIONS   Rest the injured area for as long as directed by your caregiver. Then slowly start using the joint as directed by your caregiver and as the pain allows. Crutches as directed may be useful if the ankles, knees or hips are involved. If the knee was splinted or casted, continue use and care as directed. If an stretchy or elastic wrapping bandage has been applied today, it should be removed and re-applied every 3 to 4 hours. It should not be applied tightly, but firmly enough to keep swelling down. Watch toes and feet for swelling, bluish discoloration, coldness, numbness or excessive pain. If any of these problems (symptoms) occur, remove the ace bandage and re-apply more loosely. If these symptoms persist, contact your caregiver or return to this location.   For the first 24 hours, keep the injured extremity elevated on pillows while lying down.   Apply ice for 15 to 20 minutes to the sore joint every couple hours while awake for the first half day. Then 3 to 4 times per day for the first 48 hours. Put the ice in a plastic bag and place a towel between the bag of ice and your skin.   Wear any splinting, casting, elastic bandage applications, or slings as instructed.   Only take over-the-counter or prescription medicines for pain,  discomfort, or fever as directed by your caregiver. Do not use aspirin immediately after the injury unless instructed by your physician. Aspirin can cause increased bleeding and bruising of the tissues.   If you were given crutches, continue to use them as instructed and do not resume weight bearing on the sore joint until instructed.  Persistent pain and inability to use the sore joint as directed for more than 2 to 3 days are warning signs indicating that you should see a caregiver for a follow-up visit as soon as possible. Initially, a hairline fracture (break in bone) may not be evident on X-rays. Persistent pain and swelling indicate that further evaluation, non-weight bearing or use of the joint (use of crutches or slings as instructed), or further X-rays are indicated. X-rays may sometimes not show a small fracture until a week or 10 days later. Make a follow-up appointment with your own caregiver or one to whom we have referred you. A radiologist (specialist in reading X-rays) may read your X-rays. Make sure you know how you are to obtain your X-ray results. Do not assume everything is normal if you do not hear from us. SEEK MEDICAL CARE IF: Bruising, swelling, or pain increases. SEEK IMMEDIATE MEDICAL CARE IF:   Your fingers or toes are numb or blue.   The pain is not responding to medications and continues to stay the same or get worse.   The pain in your joint becomes severe.   You develop a fever over   102 F (38.9 C).   It becomes impossible to move or use the joint.  MAKE SURE YOU:   Understand these instructions.   Will watch your condition.   Will get help right away if you are not doing well or get worse.  Document Released: 10/01/2005 Document Revised: 09/20/2011 Document Reviewed: 05/19/2008 [facility] Patient Information 2012 [place], [place].Hip Injury You have a been diagnosed with a hip injury. Falls can cause fractures to the pelvis and hip as well as very painful  bruises. These are called 'hip pointers'. Muscle injuries, arthritis, sciatica, and bursitis and can also cause severe pelvic or hip pain. An x-ray exam will usually show a fractured bone, but sometimes other studies like a CT scan or MRI may be needed to be certain about the diagnosis. All painful hip injuries should be treated like there might be a fracture until they are better or determined not to be fractures. Rest in bed over the next 3-4 days, or as long as you have severe pain. You should not bear weight on your injured hip. Use crutches or a walker. Ice packs can be applied to the injury site for 20 minutes every 2-4 hours for several days. Pain medicine may be needed to help you rest, especially at night.  A follow-up exam and further studies to determine the cause of your pain are important if your symptoms do not improve rapidly over the next week.  SEEK IMMEDIATE MEDICAL CARE IF:  You re-injure your hip.   You develop more pain, a fever, inability to walk, or other problems.  MAKE SURE YOU:   Understand these instructions.   Will watch your condition.   Will get help right away if you are not doing well or get worse.  Document Released: 11/08/2004 Document Revised: 09/20/2011 Document Reviewed: 01/20/2009 [facility] Patient Information 2012 [place], [place].Hip Pain The hips join the upper legs to the lower pelvis. The bones, cartilage, tendons, and muscles of the hip joint perform a lot of work each day holding your body weight and allowing you to move around. Hip pain is a common symptom. It can range from a minor ache to severe pain on 1 or both hips. Pain may be felt on the inside of the hip joint near the groin, or the outside near the buttocks and upper thigh. There may be swelling or stiffness as well. It occurs more often when a person walks or performs activity. There are many reasons hip pain can develop. CAUSES  It is important to work with your caregiver to identify the  cause since many conditions can impact the bones, cartilage, muscles, and tendons of the hips. Causes for hip pain include:  Broken (fractured) bones.   Separation of the thighbone from the hip socket (dislocation).   Torn cartilage of the hip joint.   Swelling (inflammation) of a tendon (tendonitis), the sac within the hip joint (bursitis), or a joint.   A weakening in the abdominal wall (hernia), affecting the nerves to the hip.   Arthritis in the hip joint or lining of the hip joint.   Pinched nerves in the back, hip, or upper thigh.   A bulging disc in the spine (herniated disc).   Rarely, bone infection or cancer.  DIAGNOSIS  The location of your hip pain will help your caregiver understand what may be causing the pain. A diagnosis is based on your medical history, your symptoms, results from your physical exam, and results from diagnostic tests. Diagnostic tests  may include X-ray exams, a computerized magnetic scan (magnetic resonance imaging, MRI), or bone scan. TREATMENT  Treatment will depend on the cause of your hip pain. Treatment may include:  Limiting activities and resting until symptoms improve.   Crutches or other walking supports (a cane or brace).   Ice, elevation, and compression.   Physical therapy or home exercises.   Shoe inserts or special shoes.   Losing weight.   Medications to reduce pain.   Undergoing surgery.  HOME CARE INSTRUCTIONS   Only take over-the-counter or prescription medicines for pain, discomfort, or fever as directed by your caregiver.   Put ice on the injured area:   Put ice in a plastic bag.   Place a towel between your skin and the bag.   Leave the ice on for 15 to 20 minutes at a time, 3 to 4 times a day.   Keep your leg raised (elevated) when possible to lessen swelling.   Avoid activities that cause pain.   Follow specific exercises as directed by your caregiver.   Sleep with a pillow between your legs on your most  comfortable side.   Record how often you have hip pain, the location of the pain, and what it feels like. This information may be helpful to you and your caregiver.   Ask your caregiver about returning to work or sports and whether you should drive.   Follow up with your caregiver for further exams, therapy, or testing as directed.  SEEK MEDICAL CARE IF:   Your pain or swelling continues or worsens after 1 week.   You are feeling unwell or have chills.   You have increasing difficulty with walking.   You have a loss of sensation or other new symptoms.   You have questions or concerns.  SEEK IMMEDIATE MEDICAL CARE IF:   You cannot put weight on the affected hip.   You have fallen.   You have a sudden increase in pain and swelling in your hip.   You have a fever.  MAKE SURE YOU:   Understand these instructions.   Will watch your condition.   Will get help right away if you are not doing well or get worse.  Document Released: 03/21/2010 Document Revised: 09/20/2011 Document Reviewed: 03/21/2010 [facility] Patient Information 2012 [place], [place].

## 2012-05-01 NOTE — ED Provider Notes (Signed)
Medical screening examination/treatment/procedure(s) were performed by non-physician practitioner and as supervising physician I was immediately available for consultation/collaboration.  Raeford Razor, MD 05/01/12 [phone]

## 2012-05-13 ENCOUNTER — Encounter (HOSPITAL_COMMUNITY): Payer: Self-pay | Admitting: Emergency Medicine

## 2012-05-13 ENCOUNTER — Emergency Department (HOSPITAL_COMMUNITY)
Admission: EM | Admit: 2012-05-13 | Discharge: 2012-05-13 | Disposition: A | Discharge status: Home / Self Care | Payer: Medicaid Other | Attending: Emergency Medicine | Admitting: Emergency Medicine

## 2012-05-13 DIAGNOSIS — G4700 Insomnia, unspecified: Secondary | ICD-10-CM | POA: Insufficient documentation

## 2012-05-13 DIAGNOSIS — Z8659 Personal history of other mental and behavioral disorders: Secondary | ICD-10-CM | POA: Insufficient documentation

## 2012-05-13 DIAGNOSIS — F411 Generalized anxiety disorder: Secondary | ICD-10-CM | POA: Insufficient documentation

## 2012-05-13 DIAGNOSIS — F17200 Nicotine dependence, unspecified, uncomplicated: Secondary | ICD-10-CM | POA: Insufficient documentation

## 2012-05-13 DIAGNOSIS — F419 Anxiety disorder, unspecified: Secondary | ICD-10-CM

## 2012-05-13 DIAGNOSIS — M79609 Pain in unspecified limb: Secondary | ICD-10-CM | POA: Insufficient documentation

## 2012-05-13 DIAGNOSIS — G8929 Other chronic pain: Secondary | ICD-10-CM

## 2012-05-13 HISTORY — DX: Unspecified injury of head, initial encounter: S09.90XA

## 2012-05-13 LAB — CBC WITH DIFFERENTIAL/PLATELET
Hemoglobin: 15.4 g/dL (ref 13.0–17.0)
Lymphocytes Relative: 32 % (ref 12–46)
Lymphs Abs: 1.8 10*3/uL (ref 0.7–4.0)
MCH: 32 pg (ref 26.0–34.0)
Monocytes Relative: 11 % (ref 3–12)
Neutro Abs: 3 10*3/uL (ref 1.7–7.7)
Neutrophils Relative %: 55 % (ref 43–77)
Platelets: 295 10*3/uL (ref 150–400)
RBC: 4.82 MIL/uL (ref 4.22–5.81)
WBC: 5.5 10*3/uL (ref 4.0–10.5)

## 2012-05-13 LAB — COMPREHENSIVE METABOLIC PANEL
ALT: 32 U/L (ref 0–53)
Alkaline Phosphatase: 53 U/L (ref 39–117)
BUN: 10 mg/dL (ref 6–23)
CO2: 21 mEq/L (ref 19–32)
Chloride: 103 mEq/L (ref 96–112)
GFR calc Af Amer: >90 mL/min (ref >90)
GFR calc non Af Amer: >90 mL/min (ref >90)
Glucose, Bld: 98 mg/dL (ref 70–99)
Potassium: 4.1 mEq/L (ref 3.5–5.1)
Sodium: 136 mEq/L (ref 135–145)
Total Bilirubin: 1 mg/dL (ref 0.3–1.2)
Total Protein: 6.9 g/dL (ref 6.0–8.3)

## 2012-05-13 LAB — RAPID URINE DRUG SCREEN, HOSP PERFORMED
Amphetamines: NOT DETECTED
Opiates: NOT DETECTED
Tetrahydrocannabinol: POSITIVE — AB

## 2012-05-13 LAB — ETHANOL: Alcohol, Ethyl (B): <11 mg/dL (ref 0–11)

## 2012-05-13 MED ORDER — ONDANSETRON HCL 4 MG PO TABS: 4.0000 mg | ORAL_TABLET | Freq: Three times a day (TID) | ORAL | Status: DC | PRN

## 2012-05-13 MED ORDER — ACETAMINOPHEN 325 MG PO TABS: 650.0000 mg | ORAL_TABLET | ORAL | Status: DC | PRN

## 2012-05-13 MED ORDER — ALUM & MAG HYDROXIDE-SIMETH 200-200-20 MG/5ML PO SUSP: 30.0000 mL | ORAL | Status: DC | PRN

## 2012-05-13 MED ORDER — NICOTINE 21 MG/24HR TD PT24: 21.0000 mg | MEDICATED_PATCH | Freq: Every day | TRANSDERMAL | Status: DC

## 2012-05-13 MED ORDER — LORAZEPAM 1 MG PO TABS: 1.0000 mg | ORAL_TABLET | Freq: Three times a day (TID) | ORAL | Status: DC | PRN

## 2012-05-13 MED ORDER — IBUPROFEN 600 MG PO TABS: 600.0000 mg | ORAL_TABLET | Freq: Three times a day (TID) | ORAL | Status: DC | PRN

## 2012-05-13 MED ORDER — ZOLPIDEM TARTRATE 5 MG PO TABS: 5.0000 mg | ORAL_TABLET | Freq: Every evening | ORAL | Status: DC | PRN

## 2012-05-13 NOTE — BH Assessment (Signed)
Assessment Note   Troy [name] is an 31 y.o. male.  Patient presents to [facility] with a main complaint of increased anxiety and insomnia. Sts he has a diagnosis of Generalized anxiety disorder and has not slept in 2-3 days. He reports and/or displays other symptoms such as agitation, attention impairment, flight of ideas, and associated problems with inattention/distractibility. Pt with several stressors: financial, housing, economical, etc.  He does not admit to suicidal ideas. No plan to commit suicide. Patient with history of self mutilation. No current reported episodes-last episode was age 27. He does not contemplate injuring another person. He denies current AVH's. However, patient sts that he has a diagnosis of schizophrenia. Admits to hearing voices and last heard voices 3 days ago. Patient also reports every other day use of alcohol-beer. He drinks anywhere from 1-20 beers per day. Patient reports THC use 1-2x's per month. Pt reports multiple previous inpatient admissions. He is unable to recall specific dates and places of admissions. Sts, "It's just to many to remember". Patient was last seen at [facility] 1 yr ago for medication mgt.   Axis I: Schizoaffective Disorder Axis II: Deferred Axis III:  Past Medical History  Diagnosis Date  . Schizophrenia   . Anxiety   . Head injury    Axis IV: economic problems, educational problems, housing problems, occupational problems, other psychosocial or environmental problems, problems related to social environment, problems with access to health care services and problems with primary support group Axis V: 51-60 moderate symptoms  Past Medical History:  Past Medical History  Diagnosis Date  . Schizophrenia   . Anxiety   . Head injury     History reviewed. No pertinent past surgical history.  Family History: History reviewed. No pertinent family history.  Social History:  reports that he has been smoking Cigarettes.  He has been smoking about  .5 packs per day. He does not have any smokeless tobacco history on file. He reports that he drinks alcohol. He reports that he uses illicit drugs (Marijuana).  Additional Social History:  Alcohol / Drug Use Pain Medications: See MAR Prescriptions: See MAR  Over the Counter: See MAR  History of alcohol / drug use?: Yes Substance #1 Name of Substance 1: Alcohol 1 - Age of First Use: 24 1 - Amount (size/oz): 1- 20 beers "Depends on the day" 1 - Frequency: "every other day I may drink 1 to 20 beers" 1 - Duration: on-going since age 76 1 - Last Use / Amount: 05/12/2012 @ 2-3 beers Substance #2 Name of Substance 2: THC 2 - Age of First Use: 20's 2 - Amount (size/oz): "very tiny amount" 2 - Frequency: daily 2 - Duration: on-going since age 30 2 - Last Use / Amount: 2-3 days ago  CIWA: CIWA-Ar BP: 135/90 mmHg Pulse Rate: 76  COWS:    Allergies: No Known Allergies  Home Medications:  (Not in a hospital admission)  OB/GYN Status:  No LMP for male patient.  General Assessment Data Location of Assessment: [facility] ED Living Arrangements: Alone;Other (Comment) (Currently alone..however in the process of moving w/ parents) Can pt return to current living arrangement?: Yes Admission Status: Voluntary Is patient capable of signing voluntary admission?: Yes Transfer from: Acute Hospital Referral Source: Self/Family/Friend     Risk to self Suicidal Ideation: No Suicidal Intent: No Is patient at risk for suicide?: No Suicidal Plan?: No Access to Means: No What has been your use of drugs/alcohol within the last 12 months?:  (Alcohol and  Drug use) Previous Attempts/Gestures: Yes How many times?:  (multiple episodes of self mutilation in the past; No SI) Other Self Harm Risks:  (history of cutting, pinching, burning,  picking, etc. ) Triggers for Past Attempts: Other (Comment);Unpredictable (Pt denies suicidal attempts; reports gestures only) Intentional Self Injurious Behavior:  Cutting;Burning;Bruising;Damaging (in the past; last episode was age 17 ) Comment - Self Injurious Behavior:  (no current self injurious behaviors; pt has history) Family Suicide History: No Recent stressful life event(s): Other (Comment) ("chronic pain issues in my leg") Persecutory voices/beliefs?: No Depression: No Depression Symptoms:  (pt denies symptoms) Substance abuse history and/or treatment for substance abuse?: No Suicide prevention information given to non-admitted patients: Not applicable  Risk to Others Homicidal Ideation: No Thoughts of Harm to Others: No Current Homicidal Intent: No Current Homicidal Plan: No Access to Homicidal Means: No Identified Victim:  (no victim identified) History of harm to others?: No Assessment of Violence: None Noted Violent Behavior Description:  (pt currently calm and cooperative) Does patient have access to weapons?: No Criminal Charges Pending?: No Does patient have a court date: No  Psychosis Hallucinations:  (pt denies, however; admits to a hx of aud hallucinations) Delusions: Unspecified (pt denies current sx's;last had sx's 3 days ago-aud halluc's)  Mental Status Report Appear/Hygiene: Disheveled Eye Contact: Good Motor Activity: Freedom of movement Speech: Logical/coherent Level of Consciousness: Alert Mood: Anxious Affect: Inconsistent with thought content;Preoccupied Anxiety Level: Panic Attacks Panic attack frequency:  (daily) Most recent panic attack:  (05/13/2012) Thought Processes: Circumstantial;Flight of Ideas;Relevant (pt appears manic; rambles, difficulty remaining on subject) Judgement: Impaired Orientation: Person;Time;Place;Situation Obsessive Compulsive Thoughts/Behaviors: None  Cognitive Functioning Concentration: Decreased Memory: Recent Intact;Remote Impaired IQ: Average Insight: Fair Impulse Control: Poor Appetite: Fair Weight Loss:  (none reported) Weight Gain:  (none reported) Sleep: No  Change Total Hours of Sleep:  (pt reports staying awake 2-3 days at a time) Vegetative Symptoms: None  ADLScreening [facility] Assessment Services) Patient's cognitive ability adequate to safely complete daily activities?: Yes Patient able to express need for assistance with ADLs?: Yes Independently performs ADLs?: Yes  Abuse/Neglect [facility]) Physical Abuse: Denies Verbal Abuse: Denies Sexual Abuse: Denies  Prior Inpatient Therapy Prior Inpatient Therapy: Yes Prior Therapy Dates:  (multiple prior therapy dates; last 4-5 yrs ago @ [facility]) Prior Therapy Facilty/Provider(s):  ([facility] and mult. others; pt unable to recall specifics) Reason for Treatment:  (pt sts, "it was for agression and schizophrenia")  Prior Outpatient Therapy Prior Outpatient Therapy: Yes Prior Therapy Dates:  (1 yr ago last seen at [facility]) Prior Therapy Facilty/Provider(s):  [facility]) Reason for Treatment:  (medication management with psychiatrist)  ADL Screening (condition at time of admission) Patient's cognitive ability adequate to safely complete daily activities?: Yes Patient able to express need for assistance with ADLs?: Yes Independently performs ADLs?: Yes Weakness of Legs: None Weakness of Arms/Hands: None  Home Assistive Devices/Equipment Home Assistive Devices/Equipment: None    Abuse/Neglect Assessment (Assessment to be complete while patient is alone) Physical Abuse: Denies Verbal Abuse: Denies Sexual Abuse: Denies Self-Neglect: Denies Values / Beliefs Cultural Requests During Hospitalization: None Spiritual Requests During Hospitalization: None   Advance Directives (For Healthcare) Advance Directive: Patient does not have advance directive Nutrition Screen Diet: Regular  Additional Information 1:1 In Past 12 Months?: No CIRT Risk: No Elopement Risk: No Does patient have medical clearance?: Yes     Disposition:  Disposition Disposition of Patient: Referred to (Referred to  [facility] and other outpatient providers) Patient referred to: [facility]  On  Site Evaluation by:   Reviewed with Physician:     Melynda Ripple [facility] 05/13/2012 5:00 PM

## 2012-05-13 NOTE — ED Notes (Signed)
PT.went to the bathroom "stated cant urinate yet"

## 2012-05-13 NOTE — ED Provider Notes (Signed)
Medical screening examination/treatment/procedure(s) were performed by non-physician practitioner and as supervising physician I was immediately available for consultation/collaboration.    Nelia Shi, MD 05/13/12 [phone]

## 2012-05-13 NOTE — ED Notes (Signed)
Pt states he has not had but one hour sleep in several days.  Concerned about not being able to take care of himself.  Says he cannot get in touch with DSS worker.  Denies SI/HI.

## 2012-05-13 NOTE — ED Notes (Addendum)
Has multiple complaints, states has insomnia, and only has slept 3 hours in past 3 days. Has a hx of schizophrenia, not taking meds for 1 yr and a half. Also hx of chronic depression-- NO suicidal ideations, states has multiple complaints, and thoughts twisted when doesn't sleep. Hx brain injury- rambling conversations, with multiple symptom complaints.   States "I just want some help to straighten out thoughts" "I am all over the place" " I am having anxiety about taking care of myself"
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
## 2013-10-19 ENCOUNTER — Encounter (HOSPITAL_COMMUNITY): Payer: Self-pay | Admitting: Emergency Medicine

## 2013-10-19 ENCOUNTER — Emergency Department (INDEPENDENT_AMBULATORY_CARE_PROVIDER_SITE_OTHER)
Admission: EM | Admit: 2013-10-19 | Discharge: 2013-10-19 | Disposition: A | Discharge status: Home / Self Care | Payer: Medicaid Other | Source: Home / Self Care | Attending: Emergency Medicine | Admitting: Emergency Medicine

## 2013-10-19 ENCOUNTER — Emergency Department (INDEPENDENT_AMBULATORY_CARE_PROVIDER_SITE_OTHER): Payer: Self-pay

## 2013-10-19 DIAGNOSIS — S60221A Contusion of right hand, initial encounter: Secondary | ICD-10-CM

## 2013-10-19 DIAGNOSIS — IMO0002 Reserved for concepts with insufficient information to code with codable children: Secondary | ICD-10-CM

## 2013-10-19 DIAGNOSIS — S60229A Contusion of unspecified hand, initial encounter: Secondary | ICD-10-CM

## 2013-10-19 MED ORDER — TRAMADOL HCL 50 MG PO TABS: 100.0000 mg | ORAL_TABLET | Freq: Three times a day (TID) | ORAL | Status: DC | PRN

## 2013-10-19 MED ORDER — HYDROCODONE-ACETAMINOPHEN 5-325 MG PO TABS
ORAL_TABLET | ORAL | Status: AC
  Filled 2013-10-19: qty 2

## 2013-10-19 MED ORDER — HYDROCODONE-ACETAMINOPHEN 5-325 MG PO TABS
2.0000 | ORAL_TABLET | Freq: Once | ORAL | Status: AC
  Administered 2013-10-19: 2 via ORAL

## 2013-10-19 NOTE — ED Notes (Signed)
Reportedly sustained right hand injury sometime last night; deformity, swelling, ecchymosis noted

## 2013-10-19 NOTE — Discharge Instructions (Signed)
[facility] [address] - you can show up for a work in appointment at 9:00 a.m.   No need for an appointment.      Hand Contusion A hand contusion is a deep bruise on your hand area. Contusions are the result of an injury that caused bleeding under the skin. The contusion may turn blue, purple, or yellow. Minor injuries will give you a painless contusion, but more severe contusions may stay painful and swollen for a few weeks. CAUSES  A contusion is usually caused by a blow, trauma, or direct force to an area of the body. SYMPTOMS   Swelling and redness of the injured area.  Discoloration of the injured area.  Tenderness and soreness of the injured area.  Pain. DIAGNOSIS  The diagnosis can be made by taking a history and performing a physical exam. An X-ray, CT scan, or MRI may be needed to determine if there were any associated injuries, such as broken bones (fractures). TREATMENT  Often, the best treatment for a hand contusion is resting, elevating, icing, and applying cold compresses to the injured area. Over-the-counter medicines may also be recommended for pain control. HOME CARE INSTRUCTIONS   Put ice on the injured area.  Put ice in a plastic bag.  Place a towel between your skin and the bag.  Leave the ice on for 15-20 minutes, 03-04 times a day.  Only take over-the-counter or prescription medicines as directed by your caregiver. Your caregiver may recommend avoiding anti-inflammatory medicines (aspirin, ibuprofen, and naproxen) for 48 hours because these medicines may increase bruising.  If told, use an elastic wrap as directed. This can help reduce swelling. You may remove the wrap for sleeping, showering, and bathing. If your fingers become numb, cold, or blue, take the wrap off and reapply it more loosely.  Elevate your hand with pillows to reduce swelling.  Avoid overusing your hand if it is painful. SEEK IMMEDIATE MEDICAL CARE  IF:   You have increased redness, swelling, or pain in your hand.  Your swelling or pain is not relieved with medicines.  You have loss of feeling in your hand or are unable to move your fingers.  Your hand turns cold or blue.  You have pain when you move your fingers.  Your hand becomes warm to the touch.  Your contusion does not improve in 2 days. MAKE SURE YOU:   Understand these instructions.  Will watch your condition.  Will get help right away if you are not doing well or get worse. Document Released: 03/23/2002 Document Revised: 06/25/2012 Document Reviewed: 03/24/2012 [facility] Patient Information 2014 [place]. RICE: Routine Care for Injuries The routine care of many injuries includes Rest, Ice, Compression, and Elevation (RICE). HOME CARE INSTRUCTIONS  Rest is needed to allow your body to heal. Routine activities can usually be resumed when comfortable. Injured tendons and bones can take up to 6 weeks to heal. Tendons are the cord-like structures that attach muscle to bone.  Ice following an injury helps keep the swelling down and reduces pain.  Put ice in a plastic bag.  Place a towel between your skin and the bag.  Leave the ice on for 15-20 minutes, 03-04 times a day. Do this while awake, for the first 24 to 48 hours. After that, continue as directed by your caregiver.  Compression helps keep swelling down. It also gives support and helps with discomfort. If an elastic bandage has been applied, it  should be removed and reapplied every 3 to 4 hours. It should not be applied tightly, but firmly enough to keep swelling down. Watch fingers or toes for swelling, bluish discoloration, coldness, numbness, or excessive pain. If any of these problems occur, remove the bandage and reapply loosely. Contact your caregiver if these problems continue.  Elevation helps reduce swelling and decreases pain. With extremities, such as the arms, hands, legs, and feet, the  injured area should be placed near or above the level of the heart, if possible. SEEK IMMEDIATE MEDICAL CARE IF:  You have persistent pain and swelling.  You develop redness, numbness, or unexpected weakness.  Your symptoms are getting worse rather than improving after several days. These symptoms may indicate that further evaluation or further X-rays are needed. Sometimes, X-rays may not show a small broken bone (fracture) until 1 week or 10 days later. Make a follow-up appointment with your caregiver. Ask when your X-ray results will be ready. Make sure you get your X-ray results. Document Released: 01/13/2001 Document Revised: 12/24/2011 Document Reviewed: 03/02/2011 [facility] Patient Information 2014 [place], [place].

## 2013-10-19 NOTE — ED Provider Notes (Signed)
Chief Complaint    Chief Complaint  Patient presents with  . Hand Injury    History of Present Illness     Troy [name] is a 33 year old schizophrenic male who has problems with anger issues and he had difficulty sleeping last night, got angry, and struck his right hand against the headboard of his bed. Ever since then he has pain and swelling over the last 3 MCP joints of the right hand. He has limited range of motion at these joints with pain on movement and numbness at the tip of the little finger. He denies any pain in the wrist, elbow, PIP, or DIP joints. The patient states this is the third time he's done this. He's not sure whether he has ever fractured his hand or not.  Review of Systems     Other than as noted above, the patient denies any of the following symptoms: Systemic:  No fevers, chills, sweats, or muscle aches.  No weight loss.  Musculoskeletal:  No joint pain, arthritis, bursitis, swelling, back pain, or neck pain. Neurological:  No muscular weakness, paresthesias, headache, or trouble with speech or coordination.  No dizziness.  PMFSH    Past medical history, family history, social history, meds, and allergies were reviewed.  The patient states he's been to. His mental health providers in the past and tried medications. He states that never done anything for him, and the medicines all had side effects. Right now he is not taking any medication. He denies any suicidal or homicidal ideation.  Physical Exam    Vital signs:  BP 134/68  Pulse 70  Temp(Src) 98 F (36.7 C) (Oral)  Resp 14  SpO2 100% Gen:  Alert and oriented times 3.  In no physical distress, but he is tearful at times and frustrated about his situation. Musculoskeletal: There is swelling and bruising over the MCP joints of the fourth and fifth digits. There is pain to palpation in these areas. He is able to fully extend his fingers. He can flex a little bit but it hurts. He reports numbness and tingling  over the tip of the little finger, but he is able to feel light touch.  Otherwise, all joints had a full a ROM with no swelling, bruising or deformity.  No edema, pulses full. Extremities were warm and pink.  Capillary refill was brisk.  Skin:  Clear, warm and dry.  No rash. Neuro:  Alert and oriented times 3.  Muscle strength was normal.  Sensation was intact to light touch.   Radiology     Dg Hand Complete Right  10/19/2013   CLINICAL DATA:  Right hand pain.  EXAM: RIGHT HAND - COMPLETE 3+ VIEW  COMPARISON:  None.  FINDINGS: There is no fracture. Soft tissue swelling is present over the 4th and 5th metacarpal heads. The alignment is anatomic.  IMPRESSION: No osseous injury. Soft tissue swelling over the dorsal 4th and 5th metacarpal heads.   Electronically Signed   By: Andreas NewportGeoffrey  Lamke M.D.   On: 10/19/2013 14:05   I reviewed the images independently and personally and concur with the radiologist's findings.  Course in Urgent [facility] Center   His hand was wrapped in Ace wrap. He was given Norco 5/325 2 by mouth for pain.  Assessment    The encounter diagnosis was Traumatic hematoma of right hand.  He has not had her fracture. I think his biggest issue is anger management and mental health issues. He's not under the care of  a mental health provider right now and I suggested that he followup at the [facility] behavioral health as soon as possible. He seems open to doing this right now and will go as soon as he can get transportation.  Plan   1.  Meds:  The following meds were prescribed:   Discharge Medication List as of 10/19/2013  2:21 PM    START taking these medications   Details  !! traMADol (ULTRAM) 50 MG tablet Take 2 tablets (100 mg total) by mouth every 8 (eight) hours as needed., Starting 10/19/2013, Until Discontinued, Normal     !! - Potential duplicate medications found. Please discuss with provider.      2.  Patient Education/Counseling:  The patient was given appropriate  handouts, self care instructions, and instructed in symptomatic relief, including rest and activity, elevation, application of ice and compression.  3.  Follow up:  The patient was told to follow up here if no better in 3 to 4 days, or sooner if becoming worse in any way, and given some red flag symptoms such as worsening pain which would prompt immediate return.  Follow up at [facility] for psychiatric issues.     Reuben Likes, MD 10/19/13 [phone]

## 2014-06-08 ENCOUNTER — Emergency Department (HOSPITAL_COMMUNITY): Payer: Medicaid Other

## 2014-06-08 ENCOUNTER — Encounter (HOSPITAL_COMMUNITY): Payer: Self-pay | Admitting: Emergency Medicine

## 2014-06-08 ENCOUNTER — Emergency Department (HOSPITAL_COMMUNITY)
Admission: EM | Admit: 2014-06-08 | Discharge: 2014-06-09 | Disposition: A | Discharge status: Home / Self Care | Payer: Medicaid Other | Attending: Emergency Medicine | Admitting: Emergency Medicine

## 2014-06-08 DIAGNOSIS — S0003XA Contusion of scalp, initial encounter: Secondary | ICD-10-CM | POA: Insufficient documentation

## 2014-06-08 DIAGNOSIS — Z8659 Personal history of other mental and behavioral disorders: Secondary | ICD-10-CM | POA: Insufficient documentation

## 2014-06-08 DIAGNOSIS — W2209XA Striking against other stationary object, initial encounter: Secondary | ICD-10-CM | POA: Insufficient documentation

## 2014-06-08 DIAGNOSIS — F17200 Nicotine dependence, unspecified, uncomplicated: Secondary | ICD-10-CM | POA: Diagnosis not present

## 2014-06-08 DIAGNOSIS — Y9289 Other specified places as the place of occurrence of the external cause: Secondary | ICD-10-CM | POA: Insufficient documentation

## 2014-06-08 DIAGNOSIS — S0083XA Contusion of other part of head, initial encounter: Secondary | ICD-10-CM | POA: Insufficient documentation

## 2014-06-08 DIAGNOSIS — Z23 Encounter for immunization: Secondary | ICD-10-CM | POA: Diagnosis not present

## 2014-06-08 DIAGNOSIS — S1093XA Contusion of unspecified part of neck, initial encounter: Principal | ICD-10-CM

## 2014-06-08 DIAGNOSIS — S0990XA Unspecified injury of head, initial encounter: Secondary | ICD-10-CM | POA: Diagnosis present

## 2014-06-08 DIAGNOSIS — F1010 Alcohol abuse, uncomplicated: Secondary | ICD-10-CM | POA: Insufficient documentation

## 2014-06-08 DIAGNOSIS — F10920 Alcohol use, unspecified with intoxication, uncomplicated: Secondary | ICD-10-CM

## 2014-06-08 DIAGNOSIS — Y9389 Activity, other specified: Secondary | ICD-10-CM | POA: Diagnosis not present

## 2014-06-08 LAB — ETHANOL: Alcohol, Ethyl (B): 305 mg/dL — ABNORMAL HIGH (ref 0–11)

## 2014-06-08 MED ORDER — TETANUS-DIPHTH-ACELL PERTUSSIS 5-2.5-18.5 LF-MCG/0.5 IM SUSP
0.5000 mL | Freq: Once | INTRAMUSCULAR | Status: AC
  Administered 2014-06-08: 0.5 mL via INTRAMUSCULAR
  Filled 2014-06-08: qty 0.5

## 2014-06-08 NOTE — ED Notes (Signed)
Patient escorted to [facility] with [employer].

## 2014-06-08 NOTE — ED Provider Notes (Signed)
CSN: [id number]     Arrival date & time 06/08/14  2130 History   First MD Initiated Contact with Patient 06/08/14 2142     Chief Complaint  Patient presents with  . Alcohol Intoxication  . Head Injury   Level V caveat applies secondary to patient cooperativeness.  HPI  History provided by [employer]. Patient brought in please custody with wort for his arrest. While he was being arrested and that the magistrate office he banged his forehead against a metal table. He had a small abrasion with bleeding and swelling. Patient is intoxicated and has been drinking elbow. He has also been uncooperative with the police. Patient does not provide much history. He is standing and yelling at plolice and not following their instructions.    Past Medical History  Diagnosis Date  . Schizophrenia   . Anxiety   . Head injury    History reviewed. No pertinent past surgical history. History reviewed. No pertinent family history. History  Substance Use Topics  . Smoking status: Current Every Day Smoker -- 0.50 packs/day    Types: Cigarettes  . Smokeless tobacco: Not on file  . Alcohol Use: Yes     Comment: occasionally, 1 drink today    Review of Systems  Unable to perform ROS: Other      Allergies  Review of patient's allergies indicates no known allergies.  Home Medications   Prior to Admission medications   Medication Sig Start Date End Date Taking? Authorizing Provider  acetaminophen (TYLENOL) 500 MG tablet Take 500 mg by mouth every 6 (six) hours as needed. For pain and swelling    Historical Provider, MD  ibuprofen (ADVIL,MOTRIN) 200 MG tablet Take 400 mg by mouth every 6 (six) hours as needed. For pain    Historical Provider, MD  LORazepam (ATIVAN) 1 MG tablet Take 1 mg by mouth every 8 (eight) hours as needed. anxiety    Historical Provider, MD  traMADol (ULTRAM) 50 MG tablet Take 50 mg by mouth every 6 (six) hours as needed. For pain    Historical Provider, MD  traMADol (ULTRAM) 50 MG  tablet Take 2 tablets (100 mg total) by mouth every 8 (eight) hours as needed. 10/19/13   Reuben Likes, MD   BP 133/92  Pulse 111  Temp(Src) 97.7 F (36.5 C) (Oral)  Resp 24  SpO2 99% Physical Exam  Nursing note and vitals reviewed. Constitutional: He appears well-developed and well-nourished.  HENT:  Head: Normocephalic.  There is a small hematoma to the middle of the forehead with overlying abrasion. No gaping wound. No active bleeding.  Eyes: Conjunctivae and EOM are normal. Pupils are equal, round, and reactive to light.  Cardiovascular: Normal rate and regular rhythm.   Pulmonary/Chest: Effort normal and breath sounds normal. No respiratory distress.  Neurological: He is alert.  Movements normal in all extremities. No focal deficit.    ED Course  Procedures   COORDINATION OF CARE:  Nursing notes reviewed. Vital signs reviewed. Initial pt interview and examination performed.   Filed Vitals:   06/08/14 2134  BP: 133/92  Pulse: 111  Temp: 97.7 F (36.5 C)  TempSrc: Oral  Resp: 24  SpO2: 99%    9:47 PM-patient seen and evaluated. He is standing walking on his own in handcuffs with police escort. He is using profanities. Has small hematoma to the forehead no depressed skull fractures. No focal neuro deficits. He is uncooperative  patient has been medically cleared. He may be discharged in police  custody to jail. He is intoxicated.   Treatment plan initiated:he  Medications  Tdap (BOOSTRIX) injection 0.5 mL (not administered)    Results for orders placed during the hospital encounter of 06/08/14  ETHANOL      Result Value Ref Range   Alcohol, Ethyl (B) 305 (*) 0 - 11 mg/dL     Imaging Review Ct Head Wo Contrast  06/08/2014   CLINICAL DATA:  Alcohol intoxication.  Head injury.  Combative.  EXAM: CT HEAD WITHOUT CONTRAST  TECHNIQUE: Contiguous axial images were obtained from the base of the skull through the vertex without intravenous contrast.  COMPARISON:   03/05/2012  FINDINGS: Ventricles and sulci appear symmetrical. No mass effect or midline shift. No abnormal extra-axial fluid collections. Gray-white matter junctions are distinct. Basal cisterns are not effaced. No evidence of acute intracranial hemorrhage. No depressed skull fractures. Visualized paranasal sinuses and mastoid air cells are not opacified.  IMPRESSION: No acute intracranial abnormalities.   Electronically Signed   By: Burman Nieves M.D.   On: 06/08/2014 22:57     MDM   Final diagnoses:  Alcohol intoxication, uncomplicated  Traumatic hematoma of forehead, initial encounter        Angus Seller, PA-C 06/08/14 2334

## 2014-06-08 NOTE — ED Notes (Addendum)
Pt's breathe is strongly smelling of ETOH. Pt's pupils reactive. Pt not able to answer LOC questions. Pt in handcuffs in [employer] custody.
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
## 2014-06-08 NOTE — ED Notes (Signed)
Peter, PA-C, at the bedside.  

## 2014-06-08 NOTE — ED Notes (Signed)
Patient not verbally responding to questions, and does not believe he is in the emergency room.  Will provide tetanus shot and attempt CT imaging.

## 2014-06-08 NOTE — Discharge Instructions (Signed)
Drink responsibly. Never drink and drive. Followup with a primary care provider.    Alcohol Intoxication Alcohol intoxication occurs when you drink enough alcohol that it affects your ability to function. It can be mild or very severe. Drinking a lot of alcohol in a short time is called binge drinking. This can be very harmful. Drinking alcohol can also be more dangerous if you are taking medicines or other drugs. Some of the effects caused by alcohol may include:  Loss of coordination.  Changes in mood and behavior.  Unclear thinking.  Trouble talking (slurred speech).  Throwing up (vomiting).  Confusion.  Slowed breathing.  Twitching and shaking (seizures).  Loss of consciousness. HOME CARE  Do not drive after drinking alcohol.  Drink enough water and fluids to keep your pee (urine) clear or pale yellow. Avoid caffeine.  Only take medicine as told by your doctor. GET HELP IF:  You throw up (vomit) many times.  You do not feel better after a few days.  You frequently have alcohol intoxication. Your doctor can help decide if you should see a substance use treatment counselor. GET HELP RIGHT AWAY IF:  You become shaky when you stop drinking.  You have twitching and shaking.  You throw up blood. It may look bright red or like coffee grounds.  You notice blood in your poop (bowel movements).  You become lightheaded or pass out (faint). MAKE SURE YOU:   Understand these instructions.  Will watch your condition.  Will get help right away if you are not doing well or get worse. Document Released: 03/19/2008 Document Revised: 06/03/2013 Document Reviewed: 03/06/2013 [facility] Patient Information 2015 [place], [place]. This information is not intended to replace advice given to you by your health care provider. Make sure you discuss any questions you have with your health care provider.    Hematoma A hematoma is a collection of blood. The collection of blood can  turn into a hard, painful lump under the skin. Your skin may turn blue or yellow if the hematoma is close to the surface of the skin. Most hematomas get better in a few days to weeks. Some hematomas are serious and need medical care. Hematomas can be very small or very big. HOME CARE  Apply ice to the injured area:  Put ice in a plastic bag.  Place a towel between your skin and the bag.  Leave the ice on for 20 minutes, 2-3 times a day for the first 1 to 2 days.  After the first 2 days, switch to using warm packs on the injured area.  Raise (elevate) the injured area to lessen pain and puffiness (swelling). You may also wrap the area with an elastic bandage. Make sure the bandage is not wrapped too tight.  If you have a painful hematoma on your leg or foot, you may use crutches for a couple days.  Only take medicines as told by your doctor. GET HELP RIGHT AWAY IF:   Your pain gets worse.  Your pain is not controlled with medicine.  You have a fever.  Your puffiness gets worse.  Your skin turns more blue or yellow.  Your skin over the hematoma breaks or starts bleeding.  Your hematoma is in your chest or belly (abdomen) and you are short of breath, feel weak, or have a change in consciousness.  Your hematoma is on your scalp and you have a headache that gets worse or a change in alertness or consciousness. MAKE SURE YOU:  Understand these instructions.  Will watch your condition.  Will get help right away if you are not doing well or get worse. Document Released: 11/08/2004 Document Revised: 06/03/2013 Document Reviewed: 03/11/2013 [facility] Patient Information 2015 [place], [place]. This information is not intended to replace advice given to you by your health care provider. Make sure you discuss any questions you have with your health care provider.

## 2014-06-09 NOTE — ED Provider Notes (Signed)
Medical screening examination/treatment/procedure(s) were performed by non-physician practitioner and as supervising physician I was immediately available for consultation/collaboration.   EKG Interpretation None        Sinaya Minogue N Krystine Pabst, DO 06/09/14 1657 
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
## 2017-03-19 ENCOUNTER — Encounter (HOSPITAL_COMMUNITY): Payer: Self-pay | Admitting: Emergency Medicine

## 2017-03-19 ENCOUNTER — Emergency Department (HOSPITAL_COMMUNITY)
Admission: EM | Admit: 2017-03-19 | Discharge: 2017-03-20 | Disposition: A | Discharge status: Other Acute Inpatient Hospital | Payer: Medicaid Other | Attending: Emergency Medicine | Admitting: Emergency Medicine

## 2017-03-19 DIAGNOSIS — F1090 Alcohol use, unspecified, uncomplicated: Secondary | ICD-10-CM | POA: Diagnosis present

## 2017-03-19 DIAGNOSIS — F17210 Nicotine dependence, cigarettes, uncomplicated: Secondary | ICD-10-CM | POA: Insufficient documentation

## 2017-03-19 DIAGNOSIS — Y906 Blood alcohol level of 120-199 mg/100 ml: Secondary | ICD-10-CM | POA: Insufficient documentation

## 2017-03-19 DIAGNOSIS — F250 Schizoaffective disorder, bipolar type: Secondary | ICD-10-CM | POA: Diagnosis present

## 2017-03-19 DIAGNOSIS — Z046 Encounter for general psychiatric examination, requested by authority: Secondary | ICD-10-CM | POA: Insufficient documentation

## 2017-03-19 DIAGNOSIS — F1099 Alcohol use, unspecified with unspecified alcohol-induced disorder: Secondary | ICD-10-CM | POA: Insufficient documentation

## 2017-03-19 DIAGNOSIS — IMO0002 Reserved for concepts with insufficient information to code with codable children: Secondary | ICD-10-CM | POA: Diagnosis present

## 2017-03-19 DIAGNOSIS — F1220 Cannabis dependence, uncomplicated: Secondary | ICD-10-CM | POA: Diagnosis present

## 2017-03-19 LAB — RAPID URINE DRUG SCREEN, HOSP PERFORMED
AMPHETAMINES: NOT DETECTED
BARBITURATES: NOT DETECTED
BENZODIAZEPINES: NOT DETECTED
COCAINE: NOT DETECTED
Opiates: NOT DETECTED
TETRAHYDROCANNABINOL: POSITIVE — AB

## 2017-03-19 LAB — COMPREHENSIVE METABOLIC PANEL
ALBUMIN: 4.2 g/dL (ref 3.5–5.0)
ALK PHOS: 49 U/L (ref 38–126)
ALT: <5 U/L — ABNORMAL LOW (ref 17–63)
ANION GAP: 7 (ref 5–15)
AST: 23 U/L (ref 15–41)
BILIRUBIN TOTAL: 0.9 mg/dL (ref 0.3–1.2)
BUN: 12 mg/dL (ref 6–20)
CO2: 25 mmol/L (ref 22–32)
Calcium: 8.8 mg/dL — ABNORMAL LOW (ref 8.9–10.3)
Chloride: 107 mmol/L (ref 101–111)
Creatinine, Ser: 0.75 mg/dL (ref 0.61–1.24)
GFR calc Af Amer: >60 mL/min (ref >60)
GFR calc non Af Amer: >60 mL/min (ref >60)
GLUCOSE: 95 mg/dL (ref 65–99)
POTASSIUM: 3.9 mmol/L (ref 3.5–5.1)
SODIUM: 139 mmol/L (ref 135–145)
Total Protein: 6.7 g/dL (ref 6.5–8.1)

## 2017-03-19 LAB — ETHANOL: Alcohol, Ethyl (B): 122 mg/dL — ABNORMAL HIGH (ref <5)

## 2017-03-19 LAB — SALICYLATE LEVEL: Salicylate Lvl: <7 mg/dL (ref 2.8–30.0)

## 2017-03-19 LAB — CBC
HEMATOCRIT: 38.5 % — AB (ref 39.0–52.0)
Hemoglobin: 13.5 g/dL (ref 13.0–17.0)
MCH: 31.3 pg (ref 26.0–34.0)
MCHC: 35.1 g/dL (ref 30.0–36.0)
MCV: 89.1 fL (ref 78.0–100.0)
Platelets: 307 10*3/uL (ref 150–400)
RBC: 4.32 MIL/uL (ref 4.22–5.81)
RDW: 13.1 % (ref 11.5–15.5)
WBC: 7.1 10*3/uL (ref 4.0–10.5)

## 2017-03-19 LAB — ACETAMINOPHEN LEVEL

## 2017-03-19 MED ORDER — ACETAMINOPHEN 500 MG PO TABS: 500.0000 mg | ORAL_TABLET | Freq: Four times a day (QID) | ORAL | Status: DC | PRN

## 2017-03-19 MED ORDER — LORAZEPAM 1 MG PO TABS
1.0000 mg | ORAL_TABLET | Freq: Three times a day (TID) | ORAL | Status: DC | PRN
  Administered 2017-03-19: 1 mg via ORAL
  Filled 2017-03-19: qty 1

## 2017-03-19 NOTE — ED Notes (Signed)
Bed: WA29 Expected date:  Expected time:  Means of arrival:  Comments: [employer]-IVC/ETOH

## 2017-03-19 NOTE — ED Notes (Signed)
Patient denies SI, HI and AVH at this time. Plan of care discussed. Patient oriented to unit. Encouragement and support provided and safety maintain. Q 15 min safety checks in place and video monitoring.
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
## 2017-03-19 NOTE — ED Notes (Signed)
Patient educated about search process and term "contraband " and routine search performed. No contraband found. 
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
## 2017-03-20 ENCOUNTER — Encounter (HOSPITAL_COMMUNITY): Payer: Self-pay

## 2017-03-20 ENCOUNTER — Inpatient Hospital Stay (HOSPITAL_COMMUNITY)
Admission: AD | Admit: 2017-03-20 | Discharge: 2017-03-26 | DRG: 885 | Disposition: A | Discharge status: Home / Self Care | Payer: Medicaid Other | Attending: Psychiatry | Admitting: Psychiatry

## 2017-03-20 DIAGNOSIS — F1220 Cannabis dependence, uncomplicated: Secondary | ICD-10-CM | POA: Diagnosis present

## 2017-03-20 DIAGNOSIS — F10239 Alcohol dependence with withdrawal, unspecified: Secondary | ICD-10-CM | POA: Diagnosis present

## 2017-03-20 DIAGNOSIS — F419 Anxiety disorder, unspecified: Secondary | ICD-10-CM | POA: Diagnosis present

## 2017-03-20 DIAGNOSIS — F17210 Nicotine dependence, cigarettes, uncomplicated: Secondary | ICD-10-CM | POA: Diagnosis present

## 2017-03-20 DIAGNOSIS — F1090 Alcohol use, unspecified, uncomplicated: Secondary | ICD-10-CM | POA: Diagnosis present

## 2017-03-20 DIAGNOSIS — G4700 Insomnia, unspecified: Secondary | ICD-10-CM | POA: Diagnosis present

## 2017-03-20 DIAGNOSIS — Z560 Unemployment, unspecified: Secondary | ICD-10-CM | POA: Diagnosis not present

## 2017-03-20 DIAGNOSIS — Z79899 Other long term (current) drug therapy: Secondary | ICD-10-CM | POA: Diagnosis not present

## 2017-03-20 DIAGNOSIS — IMO0002 Reserved for concepts with insufficient information to code with codable children: Secondary | ICD-10-CM | POA: Diagnosis present

## 2017-03-20 DIAGNOSIS — F1290 Cannabis use, unspecified, uncomplicated: Secondary | ICD-10-CM | POA: Diagnosis present

## 2017-03-20 DIAGNOSIS — F250 Schizoaffective disorder, bipolar type: Secondary | ICD-10-CM

## 2017-03-20 DIAGNOSIS — F1099 Alcohol use, unspecified with unspecified alcohol-induced disorder: Secondary | ICD-10-CM

## 2017-03-20 DIAGNOSIS — F1994 Other psychoactive substance use, unspecified with psychoactive substance-induced mood disorder: Secondary | ICD-10-CM | POA: Diagnosis not present

## 2017-03-20 MED ORDER — LORAZEPAM 1 MG PO TABS: 1.0000 mg | ORAL_TABLET | Freq: Three times a day (TID) | ORAL | Status: DC | PRN

## 2017-03-20 MED ORDER — ACETAMINOPHEN 325 MG PO TABS
650.0000 mg | ORAL_TABLET | Freq: Four times a day (QID) | ORAL | Status: DC | PRN
  Administered 2017-03-23: 650 mg via ORAL
  Filled 2017-03-20: qty 2

## 2017-03-20 MED ORDER — ASENAPINE MALEATE 5 MG SL SUBL
5.0000 mg | SUBLINGUAL_TABLET | Freq: Two times a day (BID) | SUBLINGUAL | Status: DC
  Administered 2017-03-20: 5 mg via SUBLINGUAL
  Filled 2017-03-20: qty 1

## 2017-03-20 MED ORDER — ALUM & MAG HYDROXIDE-SIMETH 200-200-20 MG/5ML PO SUSP: 30.0000 mL | ORAL | Status: DC | PRN

## 2017-03-20 MED ORDER — ASENAPINE MALEATE 5 MG SL SUBL
5.0000 mg | SUBLINGUAL_TABLET | Freq: Two times a day (BID) | SUBLINGUAL | Status: DC
  Administered 2017-03-20 – 2017-03-23 (×6): 5 mg via SUBLINGUAL
  Filled 2017-03-20 (×9): qty 1

## 2017-03-20 MED ORDER — GABAPENTIN 100 MG PO CAPS
200.0000 mg | ORAL_CAPSULE | Freq: Three times a day (TID) | ORAL | Status: DC
  Administered 2017-03-20 – 2017-03-26 (×18): 200 mg via ORAL
  Filled 2017-03-20 (×24): qty 2

## 2017-03-20 MED ORDER — GABAPENTIN 100 MG PO CAPS
200.0000 mg | ORAL_CAPSULE | Freq: Three times a day (TID) | ORAL | Status: DC
Start: 2017-03-20 — End: 2017-03-20
  Administered 2017-03-20: 200 mg via ORAL
  Filled 2017-03-20: qty 2

## 2017-03-20 MED ORDER — MAGNESIUM HYDROXIDE 400 MG/5ML PO SUSP: 30.0000 mL | Freq: Every day | ORAL | Status: DC | PRN

## 2017-03-20 MED ORDER — LORAZEPAM 1 MG PO TABS
1.0000 mg | ORAL_TABLET | Freq: Three times a day (TID) | ORAL | Status: DC | PRN
  Administered 2017-03-21 – 2017-03-24 (×4): 1 mg via ORAL
  Filled 2017-03-20 (×4): qty 1

## 2017-03-20 MED ORDER — NICOTINE 21 MG/24HR TD PT24
21.0000 mg | MEDICATED_PATCH | Freq: Every day | TRANSDERMAL | Status: DC
  Administered 2017-03-21 – 2017-03-26 (×6): 21 mg via TRANSDERMAL
  Filled 2017-03-20 (×9): qty 1

## 2017-03-20 NOTE — ED Notes (Signed)
[facility] He was calm and cooperative. Denied SI/HI/AVH. Did not appear to be responding to internal stimuli. All belongings returned to pt who signed for same.

## 2017-03-20 NOTE — Social Work (Signed)
Referred to [facility] Team, is [place] resident.  Maaz Spiering, LCSW Lead Clinical Social Worker Phone:  [phone]  
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
## 2017-03-20 NOTE — ED Notes (Signed)
Introduced self to patient. Pt oriented to unit expectations.  Assessed pt for:  A) Anxiety &/or agitation: Pt has stayed in bed all morning with no complaints voiced. He has been sleeping. He took medication as ordered.   S) Safety: Safety maintained with q-15-minute checks and hourly rounds by staff.  A) ADLs: Pt able to perform ADLs independently.  P) Pick-Up (room cleanliness): Pt's room clean and free of clutter.

## 2017-03-20 NOTE — Progress Notes (Signed)
[facility] Group Notes:  (Nursing/MHT/Case Management/Adjunct)  Date:  03/20/2017  Time:  9:14 PM  Type of Therapy:  Psychoeducational Skills  Participation Level:  Active  Participation Quality:  Attentive  Affect:  Appropriate  Cognitive:  Appropriate  Insight:  Appropriate  Engagement in Group:  Developing/Improving  Modes of Intervention:  Education  Summary of Progress/Problems: The patient shared with the group that he had a good day and that he was feeling better as well. He explained in group that he was feeling better since he did not "drink" or 'smoke" today. His goal for tomorrow is to work on thinking clearer.   Hazle CocaGOODMAN, Alli Jasmer S 03/20/2017, 9:14 PM

## 2017-03-20 NOTE — BH Assessment (Signed)
[facility] Assessment Progress Note  Per Thedore MinsMojeed Akintayo, MD, this pt requires psychiatric hospitalization.  Berneice Heinrichina Tate, RN, [facility]; they will be ready to receive pt at 13:00.  Pt presents under IVC initiated by his mother , and upheld by Dr Jannifer FranklinAkintayo, and [facility].  Pt's nurse, Diane, has been notified, and agrees to call report to [phone].  Pt is to be transported via [employer] examinerlaw enforcement.   Doylene Canninghomas Zuhair Lariccia, MA Triage Specialist [phone]
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
## 2017-03-20 NOTE — Tx Team (Signed)
Initial Treatment Plan 03/20/2017 2:36 PM Troy ChuLuke C Mayhan ONG:[id number]:[id number]    PATIENT STRESSORS: Marital or family conflict  Poor medication compliance    PATIENT STRENGTHS: General fund of knowledge Physical Health   PATIENT IDENTIFIED PROBLEMS: Agitation/aggression towards mother  "Leave"  "I don't know"                 DISCHARGE CRITERIA:  Improved stabilization in mood, thinking, and/or behavior Need for constant or close observation no longer present Verbal commitment to aftercare and medication compliance  PRELIMINARY DISCHARGE PLAN: Outpatient therapy Medication management  PATIENT/FAMILY INVOLVEMENT: This treatment plan has been presented to and reviewed with the patient, Troy [name].  The patient and family have been given the opportunity to ask questions and make suggestions.  Levin BaconHeather V Toneshia Coello, RN 03/20/2017, 2:36 PM

## 2017-03-21 DIAGNOSIS — F17210 Nicotine dependence, cigarettes, uncomplicated: Secondary | ICD-10-CM

## 2017-03-21 DIAGNOSIS — F1099 Alcohol use, unspecified with unspecified alcohol-induced disorder: Secondary | ICD-10-CM

## 2017-03-21 DIAGNOSIS — F1220 Cannabis dependence, uncomplicated: Secondary | ICD-10-CM

## 2017-03-21 DIAGNOSIS — F250 Schizoaffective disorder, bipolar type: Principal | ICD-10-CM

## 2017-03-21 NOTE — B[facility] Counselor (Signed)
Adult Comprehensive Assessment  Patient ID: Troy [name], male   DOB: 12-Feb-1981, 36 y.o.   MRN: [id number]  Information Source:    Current Stressors:  Employment / Job issues: Web designer / Lack of resources (include bankruptcy): Fixed income Housing / Lack of housing: Currently homeless as his parents are telling him he may not return there. Social relationships: Isolative-has one friend Substance abuse: Admits to alcohol, cannabis and recent meth-amphetamine use  Living/Environment/Situation:  Living Arrangements: Parent Living conditions (as described by patient or guardian): "I am banned from the house" How long has patient lived in current situation?: 1 week, prior to that was at a friend's house for 2 months, prior to that [address] in apartment for 3 years What is atmosphere in current home: Temporary  Family History:  Are you sexually active?: No What is your sexual orientation?: straight Does patient have children?: No  Childhood History:  Additional childhood history information: Very focused on relationship with mother and how he feels she is over involved Description of patient's relationship with caregiver when they were a child: "Never really talked to me much-mom accused of being depressed and psychotic" Patient's description of current relationship with people who raised him/her: Strained with mom, OK with dad as long as I don't say anything to mom Does patient have siblings?: Yes Number of Siblings: 2 Description of patient's current relationship with siblings: brothers-estranged Did patient suffer any verbal/emotional/physical/sexual abuse as a child?: No Did patient suffer from severe childhood neglect?: No Has patient ever been sexually abused/assaulted/raped as an adolescent or adult?: No Was the patient ever a victim of a crime or a disaster?: No Witnessed domestic violence?: No Has patient been effected by domestic violence as an adult?:  No  Education:  Currently a Consulting civil engineer?: No Learning disability?: No  Employment/Work Situation:   Employment situation: On disability Why is patient on disability: Mental health How long has patient been on disability: 14 years What is the longest time patient has a held a job?: 1 month 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  I recently decided to stop using methamphetamines-been using daily-smoking it.  Cannabis daily as a way of coming off of the meth.   If attempted suicide, did drugs/alcohol play a role in this?: No Has alcohol/substance abuse ever caused legal problems?: No  Social Support System:   Forensic psychologist System: Poor Describe Community Support System: "Probably my friend that I was living with." Type of faith/religion: "It's kind of a mosaic-blend-Hinduism, Buddhism" How does patient's faith help to cope with current illness?: "I do meditation and prayers daily-offerings-it brings in healing energy and liberates me"  Leisure/Recreation:   Leisure and Hobbies: "I read alot, I try to learn other languages-been studying sanskrit"  Strengths/Needs:   What things does the patient do well?: "It's hard to say-I'm a good musician and artist-I have a bass guitar, I can also play keyboards-I'm good at cleaning things." In what areas does patient struggle / problems for patient: "Lonliness, poverty, boredom, seclusion, being misunderstood, finding something to do to help people"  Discharge Plan:   Does patient have access  to transportation?: Yes  Summary/Recommendations:   Summary and Recommendations (to be completed by the evaluator): Troy [name] is a 36 YO  Caucasian male diagnosed with Schizophrenia and Polysubstance Use.  He has not been on medication since 2012, and use of meth amphetamine has increased his symptoms of paranoia, AH and mood instability.  His discharge plans are unknown at this point.  Troy [name] can benefiit from crises staibilization, medicaiton management, therapeutic milieu and referrral for services.  Troy Rogueodney B Neilson Oehlert. 03/21/2017

## 2017-03-21 NOTE — Plan of Care (Signed)
Problem: Safety: Goal: Periods of time without injury will increase Outcome: Progressing Patient contracts for safety on the unit and is on q15 minute safety checks. Low fall risk precautions in place.   
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
## 2017-03-21 NOTE — Progress Notes (Signed)
   D: During the assessment writer observed the pt looking up on to the ceiling. Pt avoided looking at the writer during the entire assessment which last several minutes.Pt did not engage the writer in open ended conversation, would only answer closed questions.   A:  Support and encouragement was offered. 15 min checks continued for safety.  R: Pt remains safe.

## 2017-03-21 NOTE — [facility] Group Notes (Signed)
[facility]  03/21/2017 , 1:16 PM    Type of Therapy:  [facility] Presentation  Participation Level:  Active  Participation Quality:  Attentive  Affect:  Blunted  Cognitive:  Oriented  Insight:  Limited  Engagement in Therapy:  Engaged  Modes of Intervention:  Discussion, Education and Socialization  Summary of Progress/Problems:  Troy [name] from [facility] came to present his recovery story and play the guitar.  Stayed the entire time, engaged throughout.  Troy [name], Troy [name] 03/21/2017 , 1:16 PM

## 2017-03-21 NOTE — Progress Notes (Signed)
Patient did not attend karaoke group tonight. 

## 2017-03-21 NOTE — Plan of Care (Signed)
Problem: Safety: Goal: Periods of time without injury will increase Outcome: Progressing Patient is free from injury.  Routine safety checks maintained every fifteen minutes.

## 2017-03-21 NOTE — Tx Team (Signed)
Interdisciplinary Treatment and Diagnostic Plan Update  03/21/2017 Time of Session: 1:21 PM  Troy [name] MRN: [id number]  Principal Diagnosis: <principal problem not specified>  Secondary Diagnoses: Active Problems:   Schizoaffective disorder, bipolar type (HCC)   Current Medications:  Current Facility-Administered Medications  Medication Dose Route Frequency Provider Last Rate Last Dose  . acetaminophen (TYLENOL) tablet 650 mg  650 mg Oral Q6H PRN Patrecia Pour, NP      . alum & mag hydroxide-simeth (MAALOX/MYLANTA) 200-200-20 MG/5ML suspension 30 mL  30 mL Oral Q4H PRN Patrecia Pour, NP      . asenapine (SAPHRIS) sublingual tablet 5 mg  5 mg Sublingual BID Patrecia Pour, NP   5 mg at 03/21/17 0819  . gabapentin (NEURONTIN) capsule 200 mg  200 mg Oral TID Patrecia Pour, NP   200 mg at 03/21/17 1200  . LORazepam (ATIVAN) tablet 1 mg  1 mg Oral Q8H PRN Patrecia Pour, NP      . magnesium hydroxide (MILK OF MAGNESIA) suspension 30 mL  30 mL Oral Daily PRN Patrecia Pour, NP      . nicotine (NICODERM CQ - dosed in mg/24 hours) patch 21 mg  21 mg Transdermal Daily Eappen, Ria Clock, MD   21 mg at 03/21/17 0818    PTA Medications: Prescriptions Prior to Admission  Medication Sig Dispense Refill Last Dose  . traMADol (ULTRAM) 50 MG tablet Take 2 tablets (100 mg total) by mouth every 8 (eight) hours as needed. (Patient not taking: Reported on 03/19/2017) 30 tablet 0 Not Taking at Unknown time    Treatment Modalities: Medication Management, Group therapy, Case management,  1 to 1 session with clinician, Psychoeducation, Recreational therapy.   Physician Treatment Plan for Primary Diagnosis: <principal problem not specified> Long Term Goal(s): Improvement in symptoms so as ready for discharge  Short Term Goals: Ability to identify changes in lifestyle to reduce recurrence of condition will improve Ability to verbalize feelings will improve Ability to disclose and discuss suicidal  ideas Ability to demonstrate self-control will improve Ability to identify and develop effective coping behaviors will improve Compliance with prescribed medications will improve Ability to identify triggers associated with substance abuse/mental health issues will improve  Medication Management: Evaluate patient's response, side effects, and tolerance of medication regimen.  Therapeutic Interventions: 1 to 1 sessions, Unit Group sessions and Medication administration.  Evaluation of Outcomes: Progressing  Physician Treatment Plan for Secondary Diagnosis: Active Problems:   Schizoaffective disorder, bipolar type ([place])   Long Term Goal(s): Improvement in symptoms so as ready for discharge  Short Term Goals: Ability to identify changes in lifestyle to reduce recurrence of condition will improve Ability to verbalize feelings will improve Ability to disclose and discuss suicidal ideas Ability to demonstrate self-control will improve Ability to identify and develop effective coping behaviors will improve Compliance with prescribed medications will improve Ability to identify triggers associated with substance abuse/mental health issues will improve  Medication Management: Evaluate patient's response, side effects, and tolerance of medication regimen.  Therapeutic Interventions: 1 to 1 sessions, Unit Group sessions and Medication administration.  Evaluation of Outcomes: Progressing   RN Treatment Plan for Primary Diagnosis: <principal problem not specified> Long Term Goal(s): Knowledge of disease and therapeutic regimen to maintain health will improve  Short Term Goals: Ability to identify and develop effective coping behaviors will improve and Compliance with prescribed medications will improve  Medication Management: RN will administer medications as ordered by provider, will assess and evaluate  patient's response and provide education to patient for prescribed medication. RN will  report any adverse and/or side effects to prescribing provider.  Therapeutic Interventions: 1 on 1 counseling sessions, Psychoeducation, Medication administration, Evaluate responses to treatment, Monitor vital signs and CBGs as ordered, Perform/monitor CIWA, COWS, AIMS and Fall Risk screenings as ordered, Perform wound care treatments as ordered.  Evaluation of Outcomes: Progressing   LCSW Treatment Plan for Primary Diagnosis: <principal problem not specified> Long Term Goal(s): Safe transition to appropriate next level of care at discharge, Engage patient in therapeutic group addressing interpersonal concerns.  Short Term Goals: Engage patient in aftercare planning with referrals and resources  Therapeutic Interventions: Assess for all discharge needs, 1 to 1 time with Social worker, Explore available resources and support systems, Assess for adequacy in community support network, Educate family and significant other(s) on suicide prevention, Complete Psychosocial Assessment, Interpersonal group therapy.  Evaluation of Outcomes: Not Met  Patient not able to return home currently, states he will [facility] up at [facility].  CSW to go over options with patient   Progress in Treatment: Attending groups: Yes Participating in groups: Yes Taking medication as prescribed: Yes Toleration medication: Yes, no side effects reported at this time Family/Significant other contact made: Yes Patient understands diagnosis: No  Limited insight Discussing patient identified problems/goals with staff: Yes Medical problems stabilized or resolved: Yes Denies suicidal/homicidal ideation: Yes Issues/concerns per patient self-inventory: None Other: N/A  New problem(s) identified: None identified at this time.   New Short Term/Long Term Goal(s): None identified at this time.   Discharge Plan or Barriers:   Reason for Continuation of Hospitalization: Delusions  Depression Hallucinations Medication  stabilization   Estimated Length of Stay: 3-5 days  Attendees: Patient: 03/21/2017  1:21 PM  Physician: Marchelle Folks, MD 03/21/2017  1:21 PM  Nursing: Hoy Register, RN 03/21/2017  1:21 PM  RN Care Manager: Lars Pinks, RN 03/21/2017  1:21 PM  Social Worker: Ripley Fraise 03/21/2017  1:21 PM  Recreational Therapist: Laretta Bolster  03/21/2017  1:21 PM  Other: Norberto Sorenson 03/21/2017  1:21 PM  Other:  03/21/2017  1:21 PM    Scribe for Treatment Team:  Roque Lias LCSW 03/21/2017 1:21 PM

## 2017-03-21 NOTE — Progress Notes (Signed)
DAR NOTE: Patient presents with flat affect and depressed mood.  Poor eye contact during assessment.  Denies pain, auditory and visual hallucinations.  Described energy level as normal and concentration as good.  Rates depression at 1, hopelessness at 1, and anxiety at 0.  Maintained on routine safety checks.  Medications given as prescribed.  Support and encouragement offered as needed.  Attended group and participated.  States goal for today is "start showering daily."  Patient was visible in milieu for therapy and activities.  No interaction with staff or peers.  Offered no complaint.

## 2017-03-21 NOTE — Progress Notes (Signed)
Recreation Therapy Notes  Date: 03/21/17 Time: 1000 Location: 500 Hall Dayroom  Group Topic: Wellness  Goal Area(s) Addresses:  Patient will define components of whole wellness. Patient will verbalize benefit of whole wellness.  Behavioral Response: Engaged  Intervention:  10 plastic cups, 2 soft foam balls   Activity: Bowling.  Patients were divided into two teams.  Each cup the player knocked down counted as one point.  Each player was given two chances to knock down the cups.  If a player got a strike on their first try, then it would be the next person's turn.  The team with the highest score wins the game.     Education: Wellness, Building control surveyorDischarge Planning.   Education Outcome: Acknowledges education/In group clarification offered/Needs additional education.   Clinical Observations/Feedback: Pt was engaged but flat.  Pt didn't have much interaction with his peers.  Pt would take his turn and then sit down.     Caroll RancherMarjette Ryana Montecalvo, LRT/CTRS         Caroll RancherLindsay, Dessie Delcarlo A 03/21/2017 11:47 AM

## 2017-03-21 NOTE — Progress Notes (Signed)
Nursing Progress Note 1900-0730  D) Patient presents with flat affect and depressed mood. Patient is isolative to his room, lying in bed and does not initiate contact with Clinical research associatewriter. Patient denies needs or concerns this evening and states "I'm just tired". Patient denies SI/HI/AVH or pain. Patient contracts for safety on the unit. Patient reports sleeping well with without need for medication.   A) Emotional support given. 1:1 interaction and active listening provided. No medications requested/scheduled for this shift. Opportunity for snacks and fluids provided; patient encouraged to eat and drink. Opportunities for questions or concerns presented to patient. Patient encouraged to continue to work on treatment goals. Labs, vital signs and patient behavior monitored throughout shift. Patient safety maintained with q15 min safety checks. Low fall risk precautions in place and reviewed with patient; patient verbalized understanding.  R) Patient receptive to interaction with nurse. Patient remains safe on the unit at this time. Patient is resting in bed without complaints. Will continue to monitor.

## 2017-03-21 NOTE — Plan of Care (Signed)
Problem: Activity: Goal: Interest or engagement in activities will improve Outcome: Not Progressing Patient is isolative to room this shift and did not engage with staff. Patient forwards little when speaking with Clinical research associatewriter.
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
## 2017-03-22 LAB — LIPID PANEL
Cholesterol: 189 mg/dL (ref 0–200)
HDL: 94 mg/dL (ref >40)
LDL Cholesterol: 77 mg/dL (ref 0–99)
Total CHOL/HDL Ratio: 2 RATIO
Triglycerides: 88 mg/dL (ref <150)
VLDL: 18 mg/dL (ref 0–40)

## 2017-03-22 LAB — TSH: TSH: 2.337 u[IU]/mL (ref 0.350–4.500)

## 2017-03-22 NOTE — Progress Notes (Signed)
Nursing Progress Note [id number]  D) Patient presents with flat affect and is minimal with Clinical research associatewriter. Patient is seen up in the milieu but keeps to himself. Patient attended group. Patient denies SI/HI/AVH or pain. Patient contracts for safety on the unit. Patient reports sleeping well with current regimen. Patient with no questions or concerns for writer this evening.  A)  Emotional support given. 1:1 interaction and active listening provided. No medications scheduled or requested this shift. Plan of care reviewed with patient. Snacks and fluids provided. Opportunities for questions or concerns presented to patient. Patient encouraged to continue to work on treatment goals. Labs, vital signs and patient behavior monitored throughout shift. Patient safety maintained with q15 min safety checks. Low fall risk precautions in place and reviewed with patient; patient verbalized understanding.  R) Patient receptive to interaction with nurse. Patient remains safe on the unit at this time. Patient is resting in bed without complaints. Will continue to monitor.

## 2017-03-22 NOTE — Progress Notes (Signed)
DAR NOTE: Patient presents with flat affect and depressed mood.  Denies pain, auditory and visual hallucinations.  Described energy level as high and concentration as good.  Reports withdrawal symptoms of cravings and irritability.  Rates depression at 0, hopelessness at 0, and anxiety at 1.  Maintained on routine safety checks.  Medications given as prescribed.  Support and encouragement offered as needed.  Attended group and participated.  States goal for today is "let go of cravings for cigarettes and beers."  Patient is visible on the unit.  Minimal interaction with staff and peers.  Offered no complaint.

## 2017-03-22 NOTE — Progress Notes (Signed)
Pt attend wrap up group. His day was a 8. His goal was give up beer wine smoking trying to forget about. He need to make up his mind to quit.

## 2017-03-22 NOTE — Progress Notes (Signed)
Recreation Therapy Notes  Date: 03/22/17 Time: 1000 Location: [facility] [address] Dayroom  Group Topic: Communication, Team Building, Problem Solving  Goal Area(s) Addresses:  Patient will effectively work with peer towards shared goal.  Patient will identify skill used to make activity successful.  Patient will identify how skills used during activity can be used to reach post d/c goals.   Behavioral Response: Engaged  Intervention: STEM Activity   Activity: [place] [place] [employer]. Patients were provided the following materials: 5 drinking straws, 5 rubber bands, 5 paper clips, 2 index cards, 2 drinking cups, and 2 toilet paper rolls. Using the provided materials patients were asked to build a launching mechanisms to launch a ping pong ball approximately 12 feet. Patients were divided into teams of 3-5.   Education: Pharmacist, communityocial Skills, Building control surveyorDischarge Planning.   Education Outcome: Acknowledges education/In group clarification offered/Needs additional education.   Clinical Observations/Feedback: Pt didn't have much interaction with peer.  Pt was quiet and attempted to make his version of a launcher when his peer was unsuccessful.  Pt was flat but was engaged in the activity.  Pt stated he had to use logic to complete the activity.  Pt explained this skill could be used to "safety".   Caroll RancherMarjette Yeraldi Fidler, LRT/CTRS       Caroll RancherLindsay, Lyonel Morejon A 03/22/2017 11:42 AM

## 2017-03-22 NOTE — [facility] Group Notes (Signed)
[facility] LCSW Group Therapy  03/22/2017  1:05 PM  Type of Therapy:  Group therapy  Participation Level:  Active  Participation Quality:  Attentive  Affect:  Flat  Cognitive:  Oriented  Insight:  Limited  Engagement in Therapy:  Limited  Modes of Intervention:  Discussion, Socialization  Summary of Progress/Problems:  Chaplain was here to lead a group on themes of hope and courage. "I think I have true hope today.  Hope can be false too.  My intentions are pure.  I am hopeful for more realistic accomplishments, and less elusive deception. I hope for more friends and more family."  Referred to the voices in his head and how they deceive him. Also acknowledged that he understands why his mother IVC'd him, even though he doesn't like it.  Daryel Geraldorth, Troy [name] 03/22/2017 1:25 PM
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
## 2017-03-23 DIAGNOSIS — F1994 Other psychoactive substance use, unspecified with psychoactive substance-induced mood disorder: Secondary | ICD-10-CM

## 2017-03-23 DIAGNOSIS — Z560 Unemployment, unspecified: Secondary | ICD-10-CM

## 2017-03-23 LAB — HEMOGLOBIN A1C
HEMOGLOBIN A1C: 4.9 % (ref 4.8–5.6)
MEAN PLASMA GLUCOSE: 94 mg/dL

## 2017-03-23 LAB — PROLACTIN: PROLACTIN: 24.7 ng/mL — AB (ref 4.0–15.2)

## 2017-03-23 MED ORDER — ASENAPINE MALEATE 5 MG SL SUBL
10.0000 mg | SUBLINGUAL_TABLET | Freq: Every day | SUBLINGUAL | Status: DC
  Administered 2017-03-23 – 2017-03-25 (×3): 10 mg via SUBLINGUAL
  Filled 2017-03-23 (×6): qty 2

## 2017-03-23 MED ORDER — ASENAPINE MALEATE 5 MG SL SUBL
5.0000 mg | SUBLINGUAL_TABLET | Freq: Every day | SUBLINGUAL | Status: DC
  Administered 2017-03-24 – 2017-03-26 (×3): 5 mg via SUBLINGUAL
  Filled 2017-03-23 (×5): qty 1

## 2017-03-23 NOTE — Progress Notes (Signed)
Writer witness patient lower himself to floor. Writer stayed and assisted patient to a chair, and remained with patient until nurse arrived from report. MHT assisted nurse with vital signes. Patient remained safe

## 2017-03-23 NOTE — Progress Notes (Signed)
[facility] Group Notes:  (Nursing/MHT/Case Management/Adjunct)  Date:  03/23/2017  Time:  9:07 PM  Type of Therapy:  Psychoeducational Skills  Participation Level:  Active  Participation Quality:  Monopolizing and Redirectable  Affect:  Excited  Cognitive:  Appropriate  Insight:  Good  Engagement in Group:  Monopolizing  Modes of Intervention:  Education  Summary of Progress/Problems: The patient spoke at great length in group this evening. The patient states that he had a long talk with his mother over the phone and admits that he doesn't get along with her. He admits that they haven't gotten along for years and that he feels angry and tense every time they talk, even if its just over the phone. He alluded to the fact that he will not be able to return home and that he needs to focus more on himself and not allow his mother to control his life. His goal for tomorrow is to work on finding a new direction in life.   Evolet Salminen S 03/23/2017, 9:07 PM

## 2017-03-23 NOTE — Progress Notes (Addendum)
Staff witness patient slam phone and storm back to room at 0720. At 0723, pt stated to writer "I'm having a panic attack, I need an ativan". Per MHT, patient found on floor at 0724. Patient sitting up on his bottom in no acute distress. MHT reported incident to Clinical research associatewriter at [phone]. Pt reports to writer "I had a panic attack, I always do after I talk to my mom on the phone". VS obtained and WNL. 1mg  ativan given PRN. Patient denied hitting his head. Patient denies pain. Per security footage reviewed, patient appears to lower self to ground and does not hit his head. Next shift made aware. Press photographerCharge nurse, [employer] and security notified.

## 2017-03-23 NOTE — Progress Notes (Signed)
D: Pt anxious on approach. Pt stated that he spoke with his mother this morning and that she makes him angry. Pt stated that he doesn't want to speak with her today. Arts administratorWriter notified staff, charge nurse, Kenney HousemanLindsay, AC., secretary, MHT's on hall and at nurses station. Pt rates depression 1/10. Anxiety 4/10. Pt denies SI. Pt reports withdrawal symptoms of cravings, tremors and agitation. Pt received Ativan this morning from prior nurse during shift change. Pt have remained calm since taking Ativan.  A: Medications reviewed with pt. Medications administered as ordered per MD. Verbal support provided. Pt encouraged to attend groups. 15 minute checks performed for safety.  R: Pt compliant with tx.

## 2017-03-23 NOTE — Progress Notes (Signed)
DAR Note: Staff observed patient yelling and using the word "bitch" on the phone and slammed the phone down.  He walked towards the medication window and put himself to the floor in a fetal position with his face flushed.  Patient assessed for seizure activities.  No seizure activity noted.    Patient was alert and oriented to person, place and time.  Vital signs within normal limit.  patient transported to his room with wheel chair.  Patient blamed his mother for his agitation and behavior this morning.  Patient states, "I don't want anything to do with my mother including phone call and visit."  MD made aware.  Safety checks continues.  Patient is in bed resting comfortably.  Breathing unlabored and even.  Patient is safe on the unit.
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
## 2017-03-23 NOTE — [facility] Group Notes (Signed)
[facility] LCSW Group Therapy  03/23/2017   Type of Therapy:  Group Therapy  Participation Level:  Did not attend  Troy [name] J Remi Rester MSW, LCSW 

## 2017-03-23 NOTE — Plan of Care (Signed)
Problem: Activity: Goal: Sleeping patterns will improve Outcome: Progressing Patient is resting in bed at this time; patient slept 6 hours last night and reports resting well with current plan of care.

## 2017-03-24 NOTE — Progress Notes (Signed)
[facility] Group Notes:  (Nursing/MHT/Case Management/Adjunct)  Date:  03/24/2017  Time:  11:49 PM  Type of Therapy:  Psychoeducational Skills  Participation Level:  Active  Participation Quality:  Appropriate  Affect:  Appropriate  Cognitive:  Appropriate  Insight:  Improving  Engagement in Group:  Engaged  Modes of Intervention:  Education  Summary of Progress/Problems: Patient states that he was proud of the fact that he overcame his fear of his mother today. He admits to getting upset when speaking to his mother since she is a trigger. He also stated that he felt "less shy" and more comfortable with opening up to his peers. His goal for tomorrow is to have more fun.   Hazle CocaGOODMAN, Ula Couvillon S 03/24/2017, 11:49 PM

## 2017-03-24 NOTE — Progress Notes (Signed)
D: Patient visible on day room. Appear anxious and agitated. Rated anxiety 5/10. Report headache of 7/10. Denies SI/HI, AH/VH at this time. No behavior issues noted. A: Staff offered support and encouragement as needed. Due med given as ordered. Routine safety checks maintained. Will continue to monitor patient. R: Patient is cooperative and safe on unit.

## 2017-03-24 NOTE — Progress Notes (Signed)
Nursing Progress Note: 7p-7a D: Pt currently presents with a sad/anxious affect and behavior. Pt states "Today, was a good day because I actually reconciled my relationship with my mom. I guess my aggression to her was all in my head." Interacting appropriately with milieu. Pt reports good sleep with current medication regimen.   A: Pt provided with medications per providers orders. Pt's labs and vitals were monitored throughout the night. Pt supported emotionally and encouraged to express concerns and questions. Pt educated on medications.  R: Pt's safety ensured with 15 minute and environmental checks. Pt currently denies SI/HI/Self Harm and AVH. Pt verbally contracts to seek staff if SI/HI or A/VH occurs and to consult with staff before acting on any harmful thoughts. Will continue to monitor.

## 2017-03-24 NOTE — Progress Notes (Signed)
D: Pt presents anxious on approach. Pt observed fidgety and pacing hallway throughout the morning and afternoon. Pt reported decreased anxiety this morning. Pt stated that he usually becomes anxious and have  panic attacks after speaking with his mother. Pt refuses to speak to his mother today. Pt denies SI/HI. Pt denies AVH.  A: Medications reviewed with pt. Medications administered as ordered per MD. Verbal support provided. Pt encouraged to attend groups. 15 minute checks performed for safety.  R: Pt compliant with tx.

## 2017-03-24 NOTE — [facility] Group Notes (Signed)
  [facility] LCSW Group Therapy  03/24/2017 10 AM  Type of Therapy:  Group Therapy  Participation Level:  Minimal  Participation Quality:  Attentive  Affect:  Anxious  Cognitive:  Alert  Insight:  None  Engagement in Therapy:  Limited  Modes of Intervention:  Discussion, Education, Exploration, Socialization and Support  Summary of Progress/Problems: Topic for today was thoughts and feelings regarding discharge. Facilitator -presented psycho education pice on self care after engaging patients in a group warm up.  Patient shared that he prefers to relax with yoga and or scripture.   Carney Bernatherine C Cedrick Partain, LCSW
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
## 2017-03-25 DIAGNOSIS — F10239 Alcohol dependence with withdrawal, unspecified: Secondary | ICD-10-CM

## 2017-03-25 DIAGNOSIS — F1290 Cannabis use, unspecified, uncomplicated: Secondary | ICD-10-CM

## 2017-03-25 MED ORDER — LORAZEPAM 1 MG PO TABS
1.0000 mg | ORAL_TABLET | Freq: Two times a day (BID) | ORAL | Status: DC | PRN
  Administered 2017-03-25 (×2): 1 mg via ORAL
  Filled 2017-03-25 (×2): qty 1

## 2017-03-25 NOTE — Progress Notes (Signed)
Adult Psychoeducational Group Note  Date:  03/25/2017 Time:  8:53 PM  Group Topic/Focus:  Wrap-Up Group:   The focus of this group is to help patients review their daily goal of treatment and discuss progress on daily workbooks.  Participation Level:  Active  Participation Quality:  Appropriate  Affect:  Appropriate  Cognitive:  Appropriate  Insight: Appropriate  Engagement in Group:  Engaged  Modes of Intervention:  Discussion  Additional Comments: The patient expressed that he had a good day and rates today a 10.The patient also said that he attended group.  Octavio Mannshigpen, Salayah Meares Lee 03/25/2017, 8:53 PM

## 2017-03-25 NOTE — Progress Notes (Signed)
Nursing Progress Note: 7p-7a D: Pt currently presents with a sad/depressed affect and behavior. Pt states "I have just have this anxiety and it just came out of nowhere. It is so crippling. Like I don't know what to say or what to do. I am just an awkward kind of fellow but the anxiety makes it worse." Interacting minimally with milieu. Pt reports fair sleep with current medication regimen.   A: Pt provided with medications per providers orders. Pt's labs and vitals were monitored throughout the night. Pt supported emotionally and encouraged to express concerns and questions. Pt educated on medications.  R: Pt's safety ensured with 15 minute and environmental checks. Pt currently denies SI/HI/Self Harm and AVH. Pt verbally contracts to seek staff if SI/HI or A/VH occurs and to consult with staff before acting on any harmful thoughts. Will continue to monitor.

## 2017-03-25 NOTE — Progress Notes (Signed)
Recreation Therapy Notes  Date: 03/25/17 Time: 1000 Location: 500 Hall Dayroom  Group Topic: Coping Skills  Goal Area(s) Addresses:  Patients will be able to identify coping skills. Patients will be able to identify the importance of coping skills. Patients will be able to identify the importance of coping skills post d/c.  Behavioral Response: Minimal  Intervention: Mind map, pencils  Activity: Mind map.  Patients were given a mind map to fill out.  LRT and patients filled in the first 8 boxes together (discipline, school, drugs, health, family, job, relationships and money).  Patients were to then come up with coping skills for each situation on their own.  The group then came back together and LRT filled in the coping skills on the board.  Patients would then fill in any holes they had on their sheets.  Education: PharmacologistCoping Skills, Building control surveyorDischarge Planning.   Education Outcome: Acknowledges understanding/In group clarification offered/Needs additional education.   Clinical Observations/Feedback: Pt was mostly quiet but was filling in his sheet.  Pt would move around to get a better view of the board.   Caroll RancherMarjette Matej Sappenfield, LRT/CTRS         Lillia AbedLindsay, Flecia Shutter A 03/25/2017 1:06 PM
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
## 2017-03-26 MED ORDER — GABAPENTIN 100 MG PO CAPS: 200.0000 mg | ORAL_CAPSULE | Freq: Three times a day (TID) | ORAL | 0 refills | Status: DC

## 2017-03-26 MED ORDER — NICOTINE 21 MG/24HR TD PT24: 21.0000 mg | MEDICATED_PATCH | Freq: Every day | TRANSDERMAL | 0 refills | Status: DC

## 2017-03-26 MED ORDER — ASENAPINE MALEATE 5 MG SL SUBL: SUBLINGUAL_TABLET | SUBLINGUAL | 0 refills | Status: AC

## 2017-03-26 MED ORDER — LORAZEPAM 1 MG PO TABS: 1.0000 mg | ORAL_TABLET | Freq: Two times a day (BID) | ORAL | 0 refills | Status: DC | PRN
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
## 2017-03-26 NOTE — Progress Notes (Signed)
  [facility]  Will you be returning to the same living situation after discharge:  No. At discharge, do you have transportation home?: Yes,  mothermother Do you have the ability to pay for your medications: Yes,  MCD  Release of information consent forms completed and in the chart;  Patient's signature needed at discharge.  Patient to Follow up at: Follow-up Information    [facility] Follow up on 03/28/2017.   Specialty:  Behavioral Health Why:  Thursday at 8:30 for your hospital follow up appointment Contact information: [address] [place] [address] [phone]           Next level of care provider [facility] Link:no  Safety Planning and Suicide Prevention discussed: Yes,  yes  Have you used any form of tobacco in the last 30 days? (Cigarettes, Smokeless Tobacco, Cigars, and/or Pipes): Yes  Has patient been referred to the Quitline?: Patient refused referral  Patient has been referred for addiction treatment: Pt. refused referral  Ida RogueRodney B Chardai Gangemi 03/26/2017, 9:06 AM

## 2017-03-26 NOTE — BHH Suicide Risk Assessment (Signed)
[facility]   Principal Problem: Schizoaffective disorder, bipolar type [facility](HCC) Discharge Diagnoses:  Patient Active Problem List   Diagnosis Date Noted  . Schizoaffective disorder, bipolar type (HCC) [F25.0] 03/20/2017  . Cannabis use disorder, severe, dependence (HCC) [F12.20] 03/20/2017  . Alcohol use disorder (HCC) [F10.99] 03/20/2017    Total Time spent with patient: 30 minutes  Musculoskeletal: Strength & Muscle Tone: within normal limits Gait & Station: normal Patient leans: N/A  Psychiatric Specialty Exam: Review of Systems  Psychiatric/Behavioral: Negative for depression, hallucinations and suicidal ideas. The patient is not nervous/anxious.   All other systems reviewed and are negative.   Blood pressure 126/78, pulse 86, temperature 97.8 F (36.6 C), temperature source Oral, resp. rate 16, height 5' 9.25" (1.759 m), weight 68 kg (150 lb), SpO2 99 %.Body mass index is 21.99 kg/m.  General Appearance: Casual  Eye Contact::  Fair  Speech:  Clear and Coherent409  Volume:  Normal  Mood:  Euthymic  Affect:  Appropriate  Thought Process:  Goal Directed and Descriptions of Associations: Intact  Orientation:  Full (Time, Place, and Person)  Thought Content:  Logical  Suicidal Thoughts:  No  Homicidal Thoughts:  No  Memory:  Immediate;   Fair Recent;   Fair Remote;   Fair  Judgement:  Fair  Insight:  Fair  Psychomotor Activity:  Normal  Concentration:  Fair  Recall:  FiservFair  Fund of Knowledge:Fair  Language: Fair  Akathisia:  No  Handed:  Right  AIMS (if indicated):     Assets:  Communication Skills Desire for Improvement  Sleep:  Number of Hours: 6  Cognition: WNL  ADL's:  Intact   Mental Status Per Nursing Assessment::   On Admission:  NA  Demographic Factors:  Male and Caucasian  Loss Factors: NA  Historical Factors: Impulsivity  Risk Reduction Factors:   Positive social support and Positive therapeutic  relationship  Continued Clinical Symptoms:  Previous Psychiatric Diagnoses and Treatments  Cognitive Features That Contribute To Risk:  None    Suicide Risk:  Minimal: No identifiable suicidal ideation.  Patients presenting with no risk factors but with morbid ruminations; may be classified as minimal risk based on the severity of the depressive symptoms  Follow-up Information    [facility] Follow up on 03/28/2017.   Specialty:  Behavioral Health Why:  Thursday at 8:30 for your hospital follow up appointment Contact information: [address] [place] [address] [phone]           Plan Of Care/Follow-up recommendations:  Activity:  no restrictions Diet:  regular Tests:  as needed Other:  none  Cray Monnin, MD 03/26/2017, 8:55 AM

## 2017-03-26 NOTE — Progress Notes (Signed)
Patient ID: Troy [name], male   DOB: 04/23/1981, 36 y.o.   MRN: [id number] Patient discharged to home self care in the company of family.  Patient denies SI, HI and AVH upon discharge and stated this place, speaking to the hospital, is a life saver.  Patient reported a reduction in symptoms.  Patient acknowledged understanding of all discharge

## 2017-03-26 NOTE — Progress Notes (Signed)
Recreation Therapy Notes  Date: 03/26/17 Time: 1000 Location: 500 Hall Dayroom  Group Topic: Communication  Goal Area(s) Addresses:  Patient will effectively communicate with peers in group.  Patient will verbalize benefit of healthy communication. Patient will verbalize positive effect of healthy communication on post d/c goals.  Patient will identify communication techniques that made activity effective for group.   Behavioral Response: None  Intervention:  Blank paper, pencils, drawings     Activity: Back-to-Back Drawing.  Patients were divided into teams of two.  One person was designated the speaker and the other the listener.  The speaker was given a picture in which they had to give instructions to the listener in a way in which they would be able to draw the picture on a separate piece of paper.  Patients would then switch rolls and change pictures.  Education: Communication, Discharge Planning  Education Outcome: Acknowledges understanding/In group clarification offered/Needs additional education.   Clinical Observations/Feedback: Pt did not participate and was in and out of group.  Pt was focused on his discharge.  Pt eventually left and did not return.     Caroll RancherMarjette Kamree Wiens, LRT/CTRS         Caroll RancherLindsay, Kathi Dohn A 03/26/2017 12:57 PM

## 2017-03-26 NOTE — Tx Team (Signed)
Interdisciplinary Treatment and Diagnostic Plan Update  03/26/2017 Time of Session: 9:15 AM  Troy [name] MRN: [id number]  Principal Diagnosis: Schizoaffective disorder, bipolar type ([place])  Secondary Diagnoses: Principal Problem:   Schizoaffective disorder, bipolar type ([place]) Active Problems:   Cannabis use disorder, severe, dependence ([place])   Current Medications:  Current Facility-Administered Medications  Medication Dose Route Frequency Provider Last Rate Last Dose  . acetaminophen (TYLENOL) tablet 650 mg  650 mg Oral Q6H PRN Patrecia Pour, NP   650 mg at 03/23/17 2108  . alum & mag hydroxide-simeth (MAALOX/MYLANTA) 200-200-20 MG/5ML suspension 30 mL  30 mL Oral Q4H PRN Patrecia Pour, NP      . asenapine (SAPHRIS) sublingual tablet 10 mg  10 mg Sublingual QHS Izediuno, Laruth Bouchard, MD   10 mg at 03/25/17 1940  . asenapine (SAPHRIS) sublingual tablet 5 mg  5 mg Sublingual Daily Izediuno, Laruth Bouchard, MD   5 mg at 03/26/17 0806  . gabapentin (NEURONTIN) capsule 200 mg  200 mg Oral TID Patrecia Pour, NP   200 mg at 03/26/17 0806  . LORazepam (ATIVAN) tablet 1 mg  1 mg Oral BID PRN Ursula Alert, MD   1 mg at 03/25/17 1939  . magnesium hydroxide (MILK OF MAGNESIA) suspension 30 mL  30 mL Oral Daily PRN Patrecia Pour, NP      . nicotine (NICODERM CQ - dosed in mg/24 hours) patch 21 mg  21 mg Transdermal Daily Ursula Alert, MD   21 mg at 03/26/17 0806    PTA Medications: Prescriptions Prior to Admission  Medication Sig Dispense Refill Last Dose  . traMADol (ULTRAM) 50 MG tablet Take 2 tablets (100 mg total) by mouth every 8 (eight) hours as needed. (Patient not taking: Reported on 03/19/2017) 30 tablet 0 Not Taking at Unknown time    Treatment Modalities: Medication Management, Group therapy, Case management,  1 to 1 session with clinician, Psychoeducation, Recreational therapy.   Physician Treatment Plan for Primary Diagnosis: Schizoaffective disorder, bipolar type  ([place]) Long Term Goal(s): Improvement in symptoms so as ready for discharge  Short Term Goals: Ability to identify changes in lifestyle to reduce recurrence of condition will improve Ability to verbalize feelings will improve Ability to disclose and discuss suicidal ideas Ability to demonstrate self-control will improve Ability to identify and develop effective coping behaviors will improve Compliance with prescribed medications will improve Ability to identify triggers associated with substance abuse/mental health issues will improve  Medication Management: Evaluate patient's response, side effects, and tolerance of medication regimen.  Therapeutic Interventions: 1 to 1 sessions, Unit Group sessions and Medication administration.  Evaluation of Outcomes: Adequate for Discharge  Physician Treatment Plan for Secondary Diagnosis: Principal Problem:   Schizoaffective disorder, bipolar type ([place]) Active Problems:   Cannabis use disorder, severe, dependence ([place])   Long Term Goal(s): Improvement in symptoms so as ready for discharge  Short Term Goals: Ability to identify changes in lifestyle to reduce recurrence of condition will improve Ability to verbalize feelings will improve Ability to disclose and discuss suicidal ideas Ability to demonstrate self-control will improve Ability to identify and develop effective coping behaviors will improve Compliance with prescribed medications will improve Ability to identify triggers associated with substance abuse/mental health issues will improve  Medication Management: Evaluate patient's response, side effects, and tolerance of medication regimen.  Therapeutic Interventions: 1 to 1 sessions, Unit Group sessions and Medication administration.  Evaluation of Outcomes: Adequate for Discharge   RN Treatment Plan for Primary  Diagnosis: Schizoaffective disorder, bipolar type ([place]) Long Term Goal(s): Knowledge of disease and therapeutic regimen  to maintain health will improve  Short Term Goals: Ability to identify and develop effective coping behaviors will improve and Compliance with prescribed medications will improve  Medication Management: RN will administer medications as ordered by provider, will assess and evaluate patient's response and provide education to patient for prescribed medication. RN will report any adverse and/or side effects to prescribing provider.  Therapeutic Interventions: 1 on 1 counseling sessions, Psychoeducation, Medication administration, Evaluate responses to treatment, Monitor vital signs and CBGs as ordered, Perform/monitor CIWA, COWS, AIMS and Fall Risk screenings as ordered, Perform wound care treatments as ordered.  Evaluation of Outcomes: Adequate for Discharge   LCSW Treatment Plan for Primary Diagnosis: Schizoaffective disorder, bipolar type ([place]) Long Term Goal(s): Safe transition to appropriate next level of care at discharge, Engage patient in therapeutic group addressing interpersonal concerns.  Short Term Goals: Engage patient in aftercare planning with referrals and resources  Therapeutic Interventions: Assess for all discharge needs, 1 to 1 time with Social worker, Explore available resources and support systems, Assess for adequacy in community support network, Educate family and significant other(s) on suicide prevention, Complete Psychosocial Assessment, Interpersonal group therapy.  Evaluation of Outcomes: Met  Patient will stay with a friend, follow up [facility]  Progress in Treatment: Attending groups: Yes Participating in groups: Yes Taking medication as prescribed: Yes Toleration medication: Yes, no side effects reported at this time Family/Significant other contact made: Yes Patient understands diagnosis: No  Limited insight Discussing patient identified problems/goals with staff: Yes Medical problems stabilized or resolved: Yes Denies suicidal/homicidal ideation:  Yes Issues/concerns per patient self-inventory: None Other: N/A  New problem(s) identified: None identified at this time.   New Short Term/Long Term Goal(s): None identified at this time.   Discharge Plan or Barriers:   Reason for Continuation of Hospitalization:  Estimated Length of Stay: D/C today  Attendees: Patient: 03/26/2017  9:15 AM  Physician: Ursula Alert, MD 03/26/2017  9:15 AM  Nursing: Hoy Register, RN 03/26/2017  9:15 AM  RN Care Manager: Lars Pinks, RN 03/26/2017  9:15 AM  Social Worker: Ripley Fraise 03/26/2017  9:15 AM  Recreational Therapist: Marjette  03/26/2017  9:15 AM  Other: Norberto Sorenson 03/26/2017  9:15 AM  Other:  03/26/2017  9:15 AM    Scribe for Treatment Team:  Roque Lias LCSW 03/26/2017 9:15 AM

## 2018-04-26 ENCOUNTER — Ambulatory Visit (HOSPITAL_COMMUNITY)
Admission: EM | Admit: 2018-04-26 | Discharge: 2018-04-26 | Disposition: A | Discharge status: Home / Self Care | Payer: Medicaid Other | Attending: Internal Medicine | Admitting: Internal Medicine

## 2018-04-26 ENCOUNTER — Encounter (HOSPITAL_COMMUNITY): Payer: Self-pay | Admitting: *Deleted

## 2018-04-26 DIAGNOSIS — R369 Urethral discharge, unspecified: Secondary | ICD-10-CM | POA: Diagnosis not present

## 2018-04-26 DIAGNOSIS — F1220 Cannabis dependence, uncomplicated: Secondary | ICD-10-CM | POA: Insufficient documentation

## 2018-04-26 DIAGNOSIS — F419 Anxiety disorder, unspecified: Secondary | ICD-10-CM | POA: Diagnosis not present

## 2018-04-26 DIAGNOSIS — Z113 Encounter for screening for infections with a predominantly sexual mode of transmission: Secondary | ICD-10-CM

## 2018-04-26 DIAGNOSIS — F250 Schizoaffective disorder, bipolar type: Secondary | ICD-10-CM | POA: Diagnosis not present

## 2018-04-26 DIAGNOSIS — Z79899 Other long term (current) drug therapy: Secondary | ICD-10-CM | POA: Insufficient documentation

## 2018-04-26 DIAGNOSIS — N451 Epididymitis: Secondary | ICD-10-CM | POA: Diagnosis not present

## 2018-04-26 DIAGNOSIS — F17210 Nicotine dependence, cigarettes, uncomplicated: Secondary | ICD-10-CM | POA: Insufficient documentation

## 2018-04-26 DIAGNOSIS — R300 Dysuria: Secondary | ICD-10-CM

## 2018-04-26 LAB — POCT URINALYSIS DIP (DEVICE)
BILIRUBIN URINE: NEGATIVE
GLUCOSE, UA: NEGATIVE mg/dL
Hgb urine dipstick: NEGATIVE
NITRITE: NEGATIVE
Protein, ur: NEGATIVE mg/dL
Specific Gravity, Urine: 1.01 (ref 1.005–1.030)
Urobilinogen, UA: 0.2 mg/dL (ref 0.0–1.0)
pH: 6 (ref 5.0–8.0)

## 2018-04-26 MED ORDER — DOXYCYCLINE HYCLATE 100 MG PO CAPS: 100.0000 mg | ORAL_CAPSULE | Freq: Two times a day (BID) | ORAL | 0 refills | Status: AC

## 2018-04-26 MED ORDER — CEFTRIAXONE SODIUM 250 MG IJ SOLR
250.0000 mg | Freq: Once | INTRAMUSCULAR | Status: AC
  Administered 2018-04-26: 250 mg via INTRAMUSCULAR

## 2018-04-26 MED ORDER — CEFTRIAXONE SODIUM 250 MG IJ SOLR
INTRAMUSCULAR | Status: AC
  Filled 2018-04-26: qty 250

## 2018-04-26 MED ORDER — AZITHROMYCIN 250 MG PO TABS: 1000.0000 mg | ORAL_TABLET | Freq: Once | ORAL | Status: DC

## 2018-04-26 NOTE — Discharge Instructions (Signed)
Please complete 10 days of antibiotics. Please establish with a primary care provider for recheck of your symptoms.  If the area which is swollen and tender to your right scrotum worsens or does not improve with provided antibiotics please follow up with primary care provider, urology, or Er as you will likely need an ultrasound to further evaluate. Please withhold from intercourse for the next week. Please use condoms to prevent STD's.

## 2018-04-26 NOTE — ED Notes (Signed)
chaperone  for provider exam 

## 2018-04-26 NOTE — ED Triage Notes (Signed)
C/O penile discharge x 1 wk with dysuria.  Also c/o "swelling below testicles".

## 2018-04-26 NOTE — ED Provider Notes (Signed)
[facility]    CSN: [id number] Arrival date & time: 04/26/18  1532     History   Chief Complaint Chief Complaint  Patient Troy [name] with  . Penile Discharge  . Dysuria    HPI Troy Troy [name] is a [age] y.o. male.   Troy Troy [name] with complaints of 1 week of penile discharge as well as pain with urination. These have somewhat improved. Increased pain and swelling to scrotum, worse to right side. No specific fevers. Denies any previous similar. No urinary frequency. Does not have a PCP. SA 10 days ago with 1 partner and did not use condoms. Hx of anxiety and schizophrenia.    ROS per HPI.      Past Medical History:  Diagnosis Date  . Anxiety   . Head injury   . Schizophrenia [facility](HCC)     Patient Active Problem List   Diagnosis Date Noted  . Schizoaffective disorder, bipolar type (HCC) 03/20/2017  . Cannabis use disorder, severe, dependence (HCC) 03/20/2017  . Alcohol use disorder 03/20/2017    Past Surgical History:  Procedure Laterality Date  . ADENOIDECTOMY    . urinary surgery as child         Home Medications    Prior to Admission medications   Medication Sig Start Date End Date Taking? Authorizing Provider  asenapine (SAPHRIS) 5 MG SUBL 24 hr tablet Take 1 tablet (5 mg) under the tongue in the morning & 2 tablets (10 mg) under the tongue at bedtime: For mood control 03/26/17  Yes Nwoko, Nicole KindredAgnes I, NP  gabapentin (NEURONTIN) 100 MG capsule Take 2 capsules (200 mg total) by mouth 3 (three) times daily. For agitation 03/26/17  Yes Nwoko, Nicole KindredAgnes I, NP  HYDROXYZINE HCL PO Take by mouth.   Yes [provider]  doxycycline (VIBRAMYCIN) 100 MG capsule Take 1 capsule (100 mg total) by mouth 2 (two) times daily for 10 days. 04/26/18 05/06/18  Georgetta HaberBurky, Natalie B, NP    Family History Family History  Problem Relation Age of Onset  . Healthy Mother   . Healthy Father     Social History Social History   Tobacco Use  . Smoking status: Current Every Day  Smoker    Packs/day: 0.50    Types: Cigarettes  . Smokeless tobacco: Never Used  Substance Use Topics  . Alcohol use: Yes    Comment: occasionally; once q 1-2 wks  . Drug use: Yes    Types: Marijuana    Comment: Hx meth use - "quit 1 month ago" 04/26/18     Allergies   Patient has no known allergies.   Review of Systems Review of Systems   Physical Exam Triage Vital Signs ED Triage Vitals  Enc Vitals Group     BP 04/26/18 1610 126/70     Pulse Rate 04/26/18 1610 82     Resp 04/26/18 1610 18     Temp 04/26/18 1610 98.3 F (36.8 C)     Temp src --      SpO2 04/26/18 1610 100 %     Weight --      Height --      Head Circumference --      Peak Flow --      Pain Score 04/26/18 1611 8     Pain Loc --      Pain Edu? --      Excl. in GC? --    No data found.  Updated Vital Signs BP 126/70  Pulse 82   Temp 98.3 F (36.8 C)   Resp 18   SpO2 100%    Physical Exam  Constitutional: He is oriented to person, place, and time. He appears well-developed and well-nourished.  Cardiovascular: Normal rate and regular rhythm.  Pulmonary/Chest: Effort normal and breath sounds normal.  Genitourinary: Penis normal. Cremasteric reflex is present. Right testis shows mass, swelling and tenderness. Circumcised.  Genitourinary Comments: Tenderness and palpable swelling proximal to testicle; no redness and not grossly enlarged  Neurological: He is alert and oriented to person, place, and time.  Skin: Skin is warm and dry.     UC Treatments / Results  Labs (all labs ordered are listed, but only abnormal results are displayed) Labs Reviewed  POCT URINALYSIS DIP (DEVICE) - Abnormal; Notable for the following components:      Result Value   Ketones, ur TRACE (*)    Leukocytes, UA SMALL (*)    All other components within normal limits  URINE CYTOLOGY ANCILLARY ONLY    EKG None  Radiology No results found.  Procedures Procedures (including critical care  time)  Medications Ordered in UC Medications  cefTRIAXone (ROCEPHIN) injection 250 mg (has no administration in time range)    Initial Impression / Assessment and Plan / UC Course  I have reviewed the triage vital signs and the nursing notes.  Pertinent labs & imaging results that were available during my care of the patient were reviewed by me and considered in my medical decision making (see chart for details).     Empiric rocephin and doxy provided at this time, concern for epididymitis although abnormal findings not completely consistent in location. Discussed follow up with PCP, urology or ER for ultrasound if any worsening, or if no improvement with completion of medications. Patient verbalized understanding and agreeable to plan.    Final Clinical Impressions(s) / UC Diagnoses   Final diagnoses:  Epididymitis     Discharge Instructions     Please complete 10 days of antibiotics. Please establish with a primary care provider for recheck of your symptoms.  If the area which is swollen and tender to your right scrotum worsens or does not improve with provided antibiotics please follow up with primary care provider, urology, or Er as you will likely need an ultrasound to further evaluate. Please withhold from intercourse for the next week. Please use condoms to prevent STD's.     ED Prescriptions    Medication Sig Dispense Auth. Provider   doxycycline (VIBRAMYCIN) 100 MG capsule Take 1 capsule (100 mg total) by mouth 2 (two) times daily for 10 days. 20 capsule Georgetta Haber, NP     Controlled Substance Prescriptions [place] Controlled Substance Registry consulted? Not Applicable   Georgetta Haber, NP 04/26/18 1653

## 2018-04-28 ENCOUNTER — Telehealth (HOSPITAL_COMMUNITY): Payer: Self-pay

## 2018-04-28 LAB — URINE CYTOLOGY ANCILLARY ONLY
Chlamydia: NEGATIVE
NEISSERIA GONORRHEA: POSITIVE — AB
TRICH (WINDOWPATH): NEGATIVE

## 2018-04-28 NOTE — Telephone Encounter (Signed)
Test for gonorrhea was positive. This was treated at the urgent care visit with IM rocephin 250mg and po zithromax 1g. Pt called regarding test results, instructed patient to refrain from sexual intercourse for 7 days after treatment to give the medicine time to work. Sexual partners need to be notified and tested/treated. Condoms may reduce risk of reinfection. Recheck or followup with PCP for further evaluation if symptoms are not improving. Answered all patient questions. [facility] notified.  

## 2018-04-29 LAB — URINE CYTOLOGY ANCILLARY ONLY
Bacterial vaginitis: POSITIVE — AB
Candida vaginitis: NEGATIVE

## 2018-05-01 ENCOUNTER — Telehealth (HOSPITAL_COMMUNITY): Payer: Self-pay

## 2018-05-01 NOTE — Telephone Encounter (Signed)
Bacterial vaginosis is positive. This was not treated at the urgent care visit.  Patient denies any penile discharge, dysuria or irritation. Pt does endorse swelling and pain has not gotten better to penis and testicles. Encouraged patient to be seen and evaluated again.

## 2018-08-15 DIAGNOSIS — L02519 Cutaneous abscess of unspecified hand: Secondary | ICD-10-CM

## 2018-08-15 DIAGNOSIS — L03119 Cellulitis of unspecified part of limb: Secondary | ICD-10-CM

## 2018-08-15 HISTORY — DX: Cellulitis of unspecified part of limb: L03.119

## 2018-08-15 HISTORY — DX: Cutaneous abscess of unspecified hand: L02.519

## 2018-08-30 ENCOUNTER — Ambulatory Visit (HOSPITAL_COMMUNITY)
Admission: EM | Admit: 2018-08-30 | Discharge: 2018-08-30 | Disposition: A | Discharge status: Home / Self Care | Payer: Medicaid Other | Attending: Family Medicine | Admitting: Family Medicine

## 2018-08-30 ENCOUNTER — Ambulatory Visit (INDEPENDENT_AMBULATORY_CARE_PROVIDER_SITE_OTHER): Payer: Medicaid Other

## 2018-08-30 ENCOUNTER — Encounter (HOSPITAL_COMMUNITY): Payer: Self-pay

## 2018-08-30 DIAGNOSIS — M79641 Pain in right hand: Secondary | ICD-10-CM | POA: Diagnosis not present

## 2018-08-30 DIAGNOSIS — X500XXA Overexertion from strenuous movement or load, initial encounter: Secondary | ICD-10-CM | POA: Diagnosis not present

## 2018-08-30 DIAGNOSIS — M79644 Pain in right finger(s): Secondary | ICD-10-CM | POA: Diagnosis not present

## 2018-08-30 DIAGNOSIS — M7989 Other specified soft tissue disorders: Secondary | ICD-10-CM

## 2018-08-30 MED ORDER — TRAMADOL HCL 50 MG PO TABS: 50.0000 mg | ORAL_TABLET | Freq: Four times a day (QID) | ORAL | 0 refills | Status: DC | PRN

## 2018-08-30 MED ORDER — KETOROLAC TROMETHAMINE 60 MG/2ML IM SOLN
60.0000 mg | Freq: Once | INTRAMUSCULAR | Status: AC
  Administered 2018-08-30: 60 mg via INTRAMUSCULAR

## 2018-08-30 MED ORDER — KETOROLAC TROMETHAMINE 60 MG/2ML IM SOLN
INTRAMUSCULAR | Status: AC
  Filled 2018-08-30: qty 2

## 2018-08-30 MED ORDER — AMOXICILLIN-POT CLAVULANATE 875-125 MG PO TABS: 1.0000 | ORAL_TABLET | Freq: Two times a day (BID) | ORAL | 0 refills | Status: DC

## 2018-08-30 NOTE — Discharge Instructions (Addendum)
We will go ahead and treat you for hand infection with antibiotics Tramadol as needed for pain For continued or worsening symptoms please follow-up

## 2018-08-30 NOTE — ED Provider Notes (Signed)
[facility]    CSN: [id number] Arrival date & time: 08/30/18  1001     History   Chief Complaint Chief Complaint  Patient presents with  . Injury    HPI Troy [name] is a 37 y.o. male.   Pt is a 37 year old male that presents with right thumb and palm  swelling that has been present and worsening over the past few days. He reports a hx of chopping wood and may have hit it on something he is unsure. He has not taken anything for pain. He denies any fever, chills. He is unable to fully flex the finger due to pain and swelling. Denies any numbness, tingling.   ROS per HPI      Past Medical History:  Diagnosis Date  . Anxiety   . Head injury   . Schizophrenia [facility])     Patient Active Problem List   Diagnosis Date Noted  . Schizoaffective disorder, bipolar type (HCC) 03/20/2017  . Cannabis use disorder, severe, dependence (HCC) 03/20/2017  . Alcohol use disorder 03/20/2017    Past Surgical History:  Procedure Laterality Date  . ADENOIDECTOMY    . urinary surgery as child         Home Medications    Prior to Admission medications   Medication Sig Start Date End Date Taking? Authorizing Provider  amoxicillin-clavulanate (AUGMENTIN) 875-125 MG tablet Take 1 tablet by mouth every 12 (twelve) hours. 08/30/18   Rollin Kotowski, Gloris Manchester A, NP  asenapine (SAPHRIS) 5 MG SUBL 24 hr tablet Take 1 tablet (5 mg) under the tongue in the morning & 2 tablets (10 mg) under the tongue at bedtime: For mood control 03/26/17   Armandina Stammer I, NP  gabapentin (NEURONTIN) 100 MG capsule Take 2 capsules (200 mg total) by mouth 3 (three) times daily. For agitation 03/26/17   Armandina Stammer I, NP  HYDROXYZINE HCL PO Take by mouth.    [provider]  traMADol (ULTRAM) 50 MG tablet Take 1 tablet (50 mg total) by mouth every 6 (six) hours as needed. 08/30/18   Janace Aris, NP    Family History Family History  Problem Relation Age of Onset  . Healthy Mother   . Healthy Father      Social History Social History   Tobacco Use  . Smoking status: Current Every Day Smoker    Packs/day: 0.50    Types: Cigarettes  . Smokeless tobacco: Never Used  Substance Use Topics  . Alcohol use: Yes    Comment: occasionally; once q 1-2 wks  . Drug use: Yes    Types: Marijuana    Comment: Hx meth use - "quit 1 month ago" 04/26/18     Allergies   Patient has no known allergies.   Review of Systems Review of Systems   Physical Exam Triage Vital Signs ED Triage Vitals  Enc Vitals Group     BP 08/30/18 1021 128/76     Pulse Rate 08/30/18 1021 (!) 110     Resp 08/30/18 1021 16     Temp 08/30/18 1021 98.3 F (36.8 C)     Temp Source 08/30/18 1021 Oral     SpO2 08/30/18 1021 97 %     Weight --      Height --      Head Circumference --      Peak Flow --      Pain Score 08/30/18 1028 10     Pain Loc --  Pain Edu? --      Excl. in GC? --    No data found.  Updated Vital Signs BP 128/76 (BP Location: Left Arm)   Pulse (!) 110   Temp 98.3 F (36.8 C) (Oral)   Resp 16   SpO2 97%   Visual Acuity Right Eye Distance:   Left Eye Distance:   Bilateral Distance:    Right Eye Near:   Left Eye Near:    Bilateral Near:     Physical Exam  Constitutional: He is oriented to person, place, and time.  Appears in pain Patient unkempt  HENT:  Head: Normocephalic and atraumatic.  Eyes: Conjunctivae are normal.  Neck: Normal range of motion.  Pulmonary/Chest: Effort normal.  Musculoskeletal: He exhibits edema and tenderness. He exhibits no deformity.  Erythema, moderate swelling, tenderness to right thumb extending into the palm.  Unable to fully flex the thumb.  Sensation and radial pulse intact  Neurological: He is alert and oriented to person, place, and time.  Skin: Skin is warm and dry.  Psychiatric: His mood appears anxious.  Anxious   Nursing note and vitals reviewed.    UC Treatments / Results  Labs (all labs ordered are listed, but only  abnormal results are displayed) Labs Reviewed - No data to display  EKG None  Radiology Dg Hand Complete Right  Result Date: 08/30/2018 CLINICAL DATA:  Hand injury while chopping wood. Injury to the right thumb. EXAM: RIGHT HAND - COMPLETE 3+ VIEW COMPARISON:  10/19/2013 FINDINGS: Negative for fracture or dislocation in the right hand. Bracelet or garment overlying the right wrist. Mild angulation at the base of the fourth metacarpal bone is chronic. No evidence for a radiopaque foreign body. There may be soft tissue swelling involving the right thumb. Stable appearance of the carpal bones. IMPRESSION: No acute bone abnormality to the right hand. Electronically Signed   By: Richarda OverlieAdam  Henn M.D.   On: 08/30/2018 11:02    Procedures Procedures (including critical care time)  Medications Ordered in UC Medications  ketorolac (TORADOL) injection 60 mg (has no administration in time range)    Initial Impression / Assessment and Plan / UC Course  I have reviewed the triage vital signs and the nursing notes.  Pertinent labs & imaging results that were available during my care of the patient were reviewed by me and considered in my medical decision making (see chart for details).     X-ray did not reveal any bony abnormalities. Most likely swelling is due to infection We will go ahead and treat with Augmentin Tramadol for pain Toradol injection given here in clinic Strict precautions and instructions to return for continued or worsening symptoms. Final Clinical Impressions(s) / UC Diagnoses   Final diagnoses:  Swelling of right hand     Discharge Instructions     We will go ahead and treat you for hand infection with antibiotics Tramadol as needed for pain For continued or worsening symptoms please follow-up    ED Prescriptions    Medication Sig Dispense Auth. Provider   traMADol (ULTRAM) 50 MG tablet Take 1 tablet (50 mg total) by mouth every 6 (six) hours as needed. 10 tablet  Timira Bieda A, NP   amoxicillin-clavulanate (AUGMENTIN) 875-125 MG tablet Take 1 tablet by mouth every 12 (twelve) hours. 14 tablet Dahlia ByesBast, Radin Raptis A, NP     Controlled Substance Prescriptions [place] Controlled Substance Registry consulted? Not Applicable   Janace ArisBast, Cy Bresee A, NP 08/30/18 1118

## 2018-08-30 NOTE — ED Triage Notes (Signed)
Pt presents with complaints of pain to his right thumb. Swelling present. States he did injury it "the day or day before". Pt will not give details.

## 2018-08-30 NOTE — ED Notes (Signed)
Pt discharged by provider.

## 2018-09-02 ENCOUNTER — Other Ambulatory Visit: Payer: Self-pay

## 2018-09-02 ENCOUNTER — Inpatient Hospital Stay (HOSPITAL_COMMUNITY)
Admission: EM | Admit: 2018-09-02 | Discharge: 2018-09-08 | DRG: 580 | Disposition: A | Discharge status: Home / Self Care | Payer: Medicaid Other | Attending: Student | Admitting: Student

## 2018-09-02 ENCOUNTER — Encounter (HOSPITAL_COMMUNITY): Payer: Self-pay | Admitting: Emergency Medicine

## 2018-09-02 DIAGNOSIS — L02511 Cutaneous abscess of right hand: Secondary | ICD-10-CM | POA: Diagnosis not present

## 2018-09-02 DIAGNOSIS — F1910 Other psychoactive substance abuse, uncomplicated: Secondary | ICD-10-CM | POA: Diagnosis present

## 2018-09-02 DIAGNOSIS — F17200 Nicotine dependence, unspecified, uncomplicated: Secondary | ICD-10-CM | POA: Diagnosis present

## 2018-09-02 DIAGNOSIS — F250 Schizoaffective disorder, bipolar type: Secondary | ICD-10-CM | POA: Diagnosis not present

## 2018-09-02 DIAGNOSIS — L03113 Cellulitis of right upper limb: Secondary | ICD-10-CM | POA: Diagnosis present

## 2018-09-02 DIAGNOSIS — F17210 Nicotine dependence, cigarettes, uncomplicated: Secondary | ICD-10-CM | POA: Diagnosis present

## 2018-09-02 DIAGNOSIS — Z23 Encounter for immunization: Secondary | ICD-10-CM

## 2018-09-02 DIAGNOSIS — L03119 Cellulitis of unspecified part of limb: Secondary | ICD-10-CM | POA: Diagnosis not present

## 2018-09-02 DIAGNOSIS — B9562 Methicillin resistant Staphylococcus aureus infection as the cause of diseases classified elsewhere: Secondary | ICD-10-CM | POA: Diagnosis present

## 2018-09-02 HISTORY — DX: Cutaneous abscess of unspecified hand: L02.519

## 2018-09-02 HISTORY — DX: Cellulitis of unspecified part of limb: L03.119

## 2018-09-02 LAB — BASIC METABOLIC PANEL
Anion gap: 12 (ref 5–15)
BUN: 7 mg/dL (ref 6–20)
CHLORIDE: 98 mmol/L (ref 98–111)
CO2: 21 mmol/L — ABNORMAL LOW (ref 22–32)
CREATININE: 0.98 mg/dL (ref 0.61–1.24)
Calcium: 9.5 mg/dL (ref 8.9–10.3)
GFR calc non Af Amer: >60 mL/min (ref >60)
GLUCOSE: 129 mg/dL — AB (ref 70–99)
Potassium: 3.6 mmol/L (ref 3.5–5.1)
Sodium: 131 mmol/L — ABNORMAL LOW (ref 135–145)

## 2018-09-02 LAB — CBC WITH DIFFERENTIAL/PLATELET
Abs Immature Granulocytes: 0.05 10*3/uL (ref 0.00–0.07)
BASOS ABS: 0 10*3/uL (ref 0.0–0.1)
Basophils Relative: 0 %
EOS ABS: 0 10*3/uL (ref 0.0–0.5)
EOS PCT: 0 %
HCT: 43.9 % (ref 39.0–52.0)
HEMOGLOBIN: 14.7 g/dL (ref 13.0–17.0)
Immature Granulocytes: 0 %
LYMPHS ABS: 1.7 10*3/uL (ref 0.7–4.0)
LYMPHS PCT: 12 %
MCH: 30.2 pg (ref 26.0–34.0)
MCHC: 33.5 g/dL (ref 30.0–36.0)
MCV: 90.3 fL (ref 80.0–100.0)
MONO ABS: 1.3 10*3/uL — AB (ref 0.1–1.0)
Monocytes Relative: 9 %
NRBC: 0 % (ref 0.0–0.2)
Neutro Abs: 11.8 10*3/uL — ABNORMAL HIGH (ref 1.7–7.7)
Neutrophils Relative %: 79 %
Platelets: 377 10*3/uL (ref 150–400)
RBC: 4.86 MIL/uL (ref 4.22–5.81)
RDW: 11.9 % (ref 11.5–15.5)
WBC: 14.9 10*3/uL — ABNORMAL HIGH (ref 4.0–10.5)

## 2018-09-02 LAB — RAPID URINE DRUG SCREEN, HOSP PERFORMED
AMPHETAMINES: POSITIVE — AB
BENZODIAZEPINES: NOT DETECTED
Barbiturates: NOT DETECTED
COCAINE: NOT DETECTED
OPIATES: POSITIVE — AB
TETRAHYDROCANNABINOL: POSITIVE — AB

## 2018-09-02 LAB — I-STAT CG4 LACTIC ACID, ED: Lactic Acid, Venous: 1.29 mmol/L (ref 0.5–1.9)

## 2018-09-02 LAB — LACTIC ACID, PLASMA: LACTIC ACID, VENOUS: 0.9 mmol/L (ref 0.5–1.9)

## 2018-09-02 MED ORDER — GABAPENTIN 300 MG PO CAPS
300.0000 mg | ORAL_CAPSULE | Freq: Three times a day (TID) | ORAL | Status: DC
  Administered 2018-09-02 – 2018-09-08 (×15): 300 mg via ORAL
  Filled 2018-09-02 (×15): qty 1

## 2018-09-02 MED ORDER — ASENAPINE MALEATE 2.5 MG SL SUBL
10.0000 mg | SUBLINGUAL_TABLET | Freq: Every day | SUBLINGUAL | Status: DC
  Administered 2018-09-02 – 2018-09-07 (×6): 10 mg via SUBLINGUAL
  Filled 2018-09-02 (×4): qty 4
  Filled 2018-09-02: qty 2
  Filled 2018-09-02: qty 4
  Filled 2018-09-02: qty 2
  Filled 2018-09-02 (×2): qty 4

## 2018-09-02 MED ORDER — NICOTINE 14 MG/24HR TD PT24
14.0000 mg | MEDICATED_PATCH | Freq: Every day | TRANSDERMAL | Status: DC
  Administered 2018-09-02 – 2018-09-08 (×7): 14 mg via TRANSDERMAL
  Filled 2018-09-02 (×7): qty 1

## 2018-09-02 MED ORDER — VANCOMYCIN HCL IN DEXTROSE 1-5 GM/200ML-% IV SOLN
1000.0000 mg | Freq: Once | INTRAVENOUS | Status: AC
  Administered 2018-09-02: 1000 mg via INTRAVENOUS
  Filled 2018-09-02: qty 200

## 2018-09-02 MED ORDER — PIPERACILLIN-TAZOBACTAM 3.375 G IVPB
3.3750 g | Freq: Three times a day (TID) | INTRAVENOUS | Status: DC
  Administered 2018-09-02 – 2018-09-06 (×12): 3.375 g via INTRAVENOUS
  Filled 2018-09-02 (×15): qty 50

## 2018-09-02 MED ORDER — VANCOMYCIN HCL 10 G IV SOLR
1500.0000 mg | Freq: Two times a day (BID) | INTRAVENOUS | Status: DC
  Administered 2018-09-02 – 2018-09-08 (×12): 1500 mg via INTRAVENOUS
  Filled 2018-09-02 (×13): qty 1500

## 2018-09-02 MED ORDER — DOCUSATE SODIUM 100 MG PO CAPS
100.0000 mg | ORAL_CAPSULE | Freq: Two times a day (BID) | ORAL | Status: DC
  Administered 2018-09-02 – 2018-09-08 (×12): 100 mg via ORAL
  Filled 2018-09-02 (×12): qty 1

## 2018-09-02 MED ORDER — BACITRACIN ZINC 500 UNIT/GM EX OINT
TOPICAL_OINTMENT | Freq: Two times a day (BID) | CUTANEOUS | Status: DC
  Administered 2018-09-02 – 2018-09-05 (×8): via TOPICAL
  Filled 2018-09-02 (×2): qty 28.4

## 2018-09-02 MED ORDER — HYDROMORPHONE HCL 1 MG/ML IJ SOLN
0.5000 mg | Freq: Once | INTRAMUSCULAR | Status: AC
  Administered 2018-09-02: 0.5 mg via INTRAVENOUS
  Filled 2018-09-02: qty 1

## 2018-09-02 MED ORDER — ASENAPINE MALEATE 2.5 MG SL SUBL
5.0000 mg | SUBLINGUAL_TABLET | Freq: Every morning | SUBLINGUAL | Status: DC
  Administered 2018-09-05 – 2018-09-08 (×4): 5 mg via SUBLINGUAL
  Filled 2018-09-02 (×3): qty 1
  Filled 2018-09-02: qty 2
  Filled 2018-09-02 (×2): qty 1
  Filled 2018-09-02 (×2): qty 2
  Filled 2018-09-02: qty 1
  Filled 2018-09-02 (×2): qty 2

## 2018-09-02 MED ORDER — PNEUMOCOCCAL VAC POLYVALENT 25 MCG/0.5ML IJ INJ
0.5000 mL | INJECTION | INTRAMUSCULAR | Status: AC
  Administered 2018-09-03: 0.5 mL via INTRAMUSCULAR
  Filled 2018-09-02: qty 0.5

## 2018-09-02 MED ORDER — PIPERACILLIN-TAZOBACTAM 3.375 G IVPB 30 MIN
3.3750 g | Freq: Once | INTRAVENOUS | Status: AC
  Administered 2018-09-02: 3.375 g via INTRAVENOUS
  Filled 2018-09-02: qty 50

## 2018-09-02 MED ORDER — HYDROXYZINE HCL 25 MG PO TABS: 25.0000 mg | ORAL_TABLET | Freq: Three times a day (TID) | ORAL | Status: DC | PRN

## 2018-09-02 MED ORDER — PIPERACILLIN-TAZOBACTAM 3.375 G IVPB 30 MIN: 3.3750 g | Freq: Four times a day (QID) | INTRAVENOUS | Status: DC

## 2018-09-02 MED ORDER — SODIUM CHLORIDE 0.9 % IV BOLUS
1000.0000 mL | Freq: Once | INTRAVENOUS | Status: AC
  Administered 2018-09-02: 1000 mL via INTRAVENOUS

## 2018-09-02 MED ORDER — ACETAMINOPHEN 325 MG PO TABS
650.0000 mg | ORAL_TABLET | Freq: Four times a day (QID) | ORAL | Status: DC | PRN
  Administered 2018-09-02: 650 mg via ORAL
  Filled 2018-09-02: qty 2

## 2018-09-02 MED ORDER — ONDANSETRON HCL 4 MG PO TABS: 4.0000 mg | ORAL_TABLET | Freq: Four times a day (QID) | ORAL | Status: DC | PRN

## 2018-09-02 MED ORDER — VALBENAZINE TOSYLATE 80 MG PO CAPS
80.0000 mg | ORAL_CAPSULE | Freq: Every day | ORAL | Status: DC
  Administered 2018-09-05: 80 mg via ORAL
  Administered 2018-09-06: 80 via ORAL
  Administered 2018-09-07 – 2018-09-08 (×2): 80 mg via ORAL
  Filled 2018-09-02 (×4): qty 1

## 2018-09-02 MED ORDER — LIDOCAINE HCL (PF) 1 % IJ SOLN
10.0000 mL | Freq: Once | INTRAMUSCULAR | Status: AC
  Administered 2018-09-02: 10 mL via INTRADERMAL
  Filled 2018-09-02: qty 10

## 2018-09-02 MED ORDER — MORPHINE SULFATE (PF) 2 MG/ML IV SOLN
2.0000 mg | INTRAVENOUS | Status: DC | PRN
  Administered 2018-09-02 – 2018-09-05 (×15): 2 mg via INTRAVENOUS
  Filled 2018-09-02 (×16): qty 1

## 2018-09-02 MED ORDER — LACTATED RINGERS IV SOLN
INTRAVENOUS | Status: DC
  Administered 2018-09-02 – 2018-09-05 (×3): via INTRAVENOUS

## 2018-09-02 MED ORDER — ONDANSETRON HCL 4 MG/2ML IJ SOLN: 4.0000 mg | Freq: Four times a day (QID) | INTRAMUSCULAR | Status: DC | PRN

## 2018-09-02 MED ORDER — PIPERACILLIN-TAZOBACTAM 3.375 G IVPB: 3.3750 g | INTRAVENOUS | Status: DC

## 2018-09-02 MED ORDER — ACETAMINOPHEN 650 MG RE SUPP: 650.0000 mg | Freq: Four times a day (QID) | RECTAL | Status: DC | PRN

## 2018-09-02 MED ORDER — ASENAPINE MALEATE 5 MG SL SUBL: 5.0000 mg | SUBLINGUAL_TABLET | Freq: Two times a day (BID) | SUBLINGUAL | Status: DC
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
## 2018-09-02 NOTE — ED Notes (Signed)
Attempted report and charge has to assign to a nurse

## 2018-09-02 NOTE — Progress Notes (Signed)
Pharmacy Antibiotic Note  Troy [name] is a 37 y.o. male admitted on 09/02/2018 with cellulitis.  Pharmacy has been consulted for vancomycin dosing. WBC 14.9. SCr 0.98. CrCl ~ 105 mL/min. He has also been started on Zosyn.   Plan: -Vancomycin 1500 mg IV Q 12 hours -Zosyn 3.375 gm IV Q 8 hours per MD -Monitor CBC, renal fx, cultures and clinical progress -VT at SS   Height: 5\' 10"  (177.8 cm) Weight: 165 lb (74.8 kg) IBW/kg (Calculated) : 73  Temp (24hrs), Avg:98.5 F (36.9 C), Min:98.2 F (36.8 C), Max:98.8 F (37.1 C)  Recent Labs  Lab 09/02/18 0336 09/02/18 0352 09/02/18 0412  WBC 14.9*  --   --   CREATININE 0.98  --   --   LATICACIDVEN  --  1.29 0.9    Estimated Creatinine Clearance: 108.6 mL/min (by C-G formula based on SCr of 0.98 mg/dL).    No Known Allergies  Antimicrobials this admission: Vanc 11/19 >>  Zosyn 11/19 >>   Dose adjustments this admission: None     Thank you for allowing pharmacy to be a part of this patient's care.  Vinnie LevelBenjamin Vaughn Beaumier, PharmD., BCPS Clinical Pharmacist Clinical phone for 09/02/18 until 3:30pm: [phone] If after 3:30pm, please refer to [facility] for unit-specific pharmacist

## 2018-09-02 NOTE — Progress Notes (Signed)
ED nurse Marylene LandAngela gave me report over the phone at 0645 this morning.  Passed information on to oncoming staff.  Thank you, Jasmine DecemberSharon, RN

## 2018-09-02 NOTE — ED Triage Notes (Signed)
Per pt he has injured his right hand and was taking antibiotics and he seems to feel like it has been getting worse. The swelling has gotten worse. It appears to have redness up his arm. Alert oriented x 4

## 2018-09-02 NOTE — ED Provider Notes (Signed)
[facility] Note   CSN: [id number] Arrival date & time: 09/02/18  0259     History   Chief Complaint Chief Complaint  Patient presents with  . Hand Pain    HPI Troy [name] is a 37 y.o. male.  Patient presents with right hand pain, redness and swelling that started several days ago. He was seen at Urgent Care on 08/30/18 and started on Augmentin but states the pain and swelling have become worse and the redness is now tracking up the arm. He states he feels that he has been running a temperature but has not measured it. No nausea or vomiting. He denies IV drug use.   The history is provided by the patient. No language interpreter was used.  Hand Pain     Past Medical History:  Diagnosis Date  . Anxiety   . Head injury   . Schizophrenia [facility](HCC)     Patient Active Problem List   Diagnosis Date Noted  . Schizoaffective disorder, bipolar type (HCC) 03/20/2017  . Cannabis use disorder, severe, dependence (HCC) 03/20/2017  . Alcohol use disorder 03/20/2017    Past Surgical History:  Procedure Laterality Date  . ADENOIDECTOMY    . urinary surgery as child          Home Medications    Prior to Admission medications   Medication Sig Start Date End Date Taking? Authorizing Provider  amoxicillin-clavulanate (AUGMENTIN) 875-125 MG tablet Take 1 tablet by mouth every 12 (twelve) hours. 08/30/18   Bast, Gloris Manchesterraci A, NP  asenapine (SAPHRIS) 5 MG SUBL 24 hr tablet Take 1 tablet (5 mg) under the tongue in the morning & 2 tablets (10 mg) under the tongue at bedtime: For mood control 03/26/17   Armandina StammerNwoko, Agnes I, NP  gabapentin (NEURONTIN) 100 MG capsule Take 2 capsules (200 mg total) by mouth 3 (three) times daily. For agitation 03/26/17   Armandina StammerNwoko, Agnes I, NP  HYDROXYZINE HCL PO Take by mouth.    [provider]  traMADol (ULTRAM) 50 MG tablet Take 1 tablet (50 mg total) by mouth every 6 (six) hours as needed. 08/30/18   Janace ArisBast,  Traci A, NP    Family History Family History  Problem Relation Age of Onset  . Healthy Mother   . Healthy Father     Social History Social History   Tobacco Use  . Smoking status: Current Every Day Smoker    Packs/day: 0.50    Types: Cigarettes  . Smokeless tobacco: Never Used  Substance Use Topics  . Alcohol use: Yes    Comment: occasionally; once q 1-2 wks  . Drug use: Yes    Types: Marijuana    Comment: Hx meth use - "quit 1 month ago" 04/26/18     Allergies   Patient has no known allergies.   Review of Systems Review of Systems  Constitutional: Positive for chills.  Respiratory: Negative.   Cardiovascular: Negative.   Gastrointestinal: Negative.   Musculoskeletal:       See HPI.  Skin: Positive for color change.  Neurological: Positive for numbness.     Physical Exam Updated Vital Signs BP (!) 142/98 (BP Location: Right Arm)   Pulse (!) 128   Temp 98.2 F (36.8 C) (Oral)   Ht 5\' 10"  (1.778 m)   Wt 74.8 kg   SpO2 100%   BMI 23.68 kg/m   Physical Exam  Constitutional: He is oriented to person, place, and time. He appears well-developed  and well-nourished.  Neck: Normal range of motion.  Pulmonary/Chest: Effort normal.  Musculoskeletal: Normal range of motion.  Right hand significantly swollen and erythematous over circumferential thumb, thenar surface and dorsum. There is a raised area that is hypopigmented to the palmar thumb overlying the PIP joint. No open wound or draining.   Neurological: He is alert and oriented to person, place, and time.  Skin: Skin is warm and dry. There is erythema.  Psychiatric: He has a normal mood and affect.         ED Treatments / Results  Labs (all labs ordered are listed, but only abnormal results are displayed) Labs Reviewed  CULTURE, BLOOD (ROUTINE X 2)  CULTURE, BLOOD (ROUTINE X 2)  CBC WITH DIFFERENTIAL/PLATELET  BASIC METABOLIC PANEL    EKG None  Radiology No results  found.  Procedures Procedures (including critical care time)  Medications Ordered in ED Medications  sodium chloride 0.9 % bolus 1,000 mL (has no administration in time range)  HYDROmorphone (DILAUDID) injection 0.5 mg (has no administration in time range)  vancomycin (VANCOCIN) IVPB 1000 mg/200 mL premix (has no administration in time range)  piperacillin-tazobactam (ZOSYN) IVPB 3.375 g (has no administration in time range)     Initial Impression / Assessment and Plan / ED Course  I have reviewed the triage vital signs and the nursing notes.  Pertinent labs & imaging results that were available during my care of the patient were reviewed by me and considered in my medical decision making (see chart for details).  Clinical Course as of Sep 02 548  Tue Sep 02, 2018  0528 I discussed the patient with hand surgery, Dr. Janee Morn.  He will evaluate the patient for likely incision and drainage.  Appreciate his consult.   [CH]    Clinical Course User Index [CH] Horton, Mayer Masker, MD    Patient presents with worsening swelling, pain and redness to the right hand encompassing the thumb, dorsum and thenar hand. Has been on Augmentin for 3 days but is getting progressively worse.   Feel the area will require surgical intervention. Dr. Janee Morn consulted by Dr. Wilkie Aye. Dr. Janee Morn defers to medicine to admit and he will see the patient in consultation.   6:00 - discussed admission with Dr. Toniann Fail who accepts for admission. Patient is pending evaluation by hand surgery.  Final Clinical Impressions(s) / ED Diagnoses   Final diagnoses:  None   1. Abscess and cellulitis of right hand  ED Discharge Orders    None       Elpidio Anis, PA-C 09/02/18 1610    Shon Baton, MD 09/02/18 [phone]

## 2018-09-02 NOTE — ED Notes (Signed)
Can't take up til 7:25.

## 2018-09-02 NOTE — Consult Note (Signed)
ORTHOPAEDIC CONSULTATION HISTORY & PHYSICAL REQUESTING PHYSICIAN: Horton, Mayer Masker, MD  Chief Complaint: right hand problem  HPI: Troy [name] is a 37 y.o. male who presented to the emergency department tonight with worsening pain and swelling of the right thumb, with redness extending into the forearm.  He was treated at [facility] urgent care 3 days ago for presumed infection, placed on Augmentin, and presents today due to worsening symptoms.  He has developed a near spontaneous rupture of the subcutaneous abscess on the volar surface of the thumb at the level of the IP joint.  He reports no clear portal for entry, but was chopping wood that prior to onset of the symptoms.  I was consulted by Dr. Wilkie Aye, who was initiating a plan for admission for IV antibiotics for medical component of this infection management, but who indicated that incising and draining this subcutaneous abscess was "outside our wheelhouse".  Past Medical History:  Diagnosis Date  . Anxiety   . Head injury   . Schizophrenia [facility])    Past Surgical History:  Procedure Laterality Date  . ADENOIDECTOMY    . urinary surgery as child     Social History   Socioeconomic History  . Marital status: Single    Spouse name: Not on file  . Number of children: Not on file  . Years of education: Not on file  . Highest education level: Not on file  Occupational History  . Not on file  Social Needs  . Financial resource strain: Not on file  . Food insecurity:    Worry: Not on file    Inability: Not on file  . Transportation needs:    Medical: Not on file    Non-medical: Not on file  Tobacco Use  . Smoking status: Current Every Day Smoker    Packs/day: 0.50    Types: Cigarettes  . Smokeless tobacco: Never Used  Substance and Sexual Activity  . Alcohol use: Yes    Comment: occasionally; once q 1-2 wks  . Drug use: Yes    Types: Marijuana    Comment: Hx meth use - "quit 1 month ago" 04/26/18  . Sexual activity:  Yes    Comment: male partners  Lifestyle  . Physical activity:    Days per week: Not on file    Minutes per session: Not on file  . Stress: Not on file  Relationships  . Social connections:    Talks on phone: Not on file    Gets together: Not on file    Attends religious service: Not on file    Active member of club or organization: Not on file    Attends meetings of clubs or organizations: Not on file    Relationship status: Not on file  Other Topics Concern  . Not on file  Social History Narrative  . Not on file   Family History  Problem Relation Age of Onset  . Healthy Mother   . Healthy Father    No Known Allergies Prior to Admission medications   Medication Sig Start Date End Date Taking? Authorizing Provider  amoxicillin-clavulanate (AUGMENTIN) 875-125 MG tablet Take 1 tablet by mouth every 12 (twelve) hours. 08/30/18  Yes Bast, Traci A, NP  traMADol (ULTRAM) 50 MG tablet Take 1 tablet (50 mg total) by mouth every 6 (six) hours as needed. 08/30/18  Yes Bast, Traci A, NP  asenapine (SAPHRIS) 5 MG SUBL 24 hr tablet Take 1 tablet (5 mg) under the tongue in  the morning & 2 tablets (10 mg) under the tongue at bedtime: For mood control Patient not taking: Reported on 09/02/2018 03/26/17   Armandina StammerNwoko, Agnes I, NP  gabapentin (NEURONTIN) 100 MG capsule Take 2 capsules (200 mg total) by mouth 3 (three) times daily. For agitation Patient not taking: Reported on 09/02/2018 03/26/17   Armandina StammerNwoko, Agnes I, NP   No results found.  Positive ROS: All other systems have been reviewed and were otherwise negative with the exception of those mentioned in the HPI and as above.  Physical Exam: Vitals: Refer to EMR. Constitutional:  WD, WN, NAD HEENT:  NCAT, EOMI Neuro/Psych:  Alert & oriented to person, place, and time; appropriate mood & affect Lymphatic: No generalized extremity edema or lymphadenopathy Extremities / MSK:  The extremities are normal with respect to appearance, ranges of motion,  joint stability, muscle strength/tone, sensation, & perfusion except as otherwise noted:  There is swelling of the right thumb extending into the thenar region of the hand.  The dorsum of the hand is also swollen.  The that appears edematous and not fluctuant.  There is a circular area of desquamation with intradermal pus at the volar surface, at the level of the IP joint fairly centrally.  The thumb is held in complete extension.  Assessment: Right thumb volar subcutaneous abscess with surrounding and spreading cellulitis  Plan: I discussed these findings with him and obtained verbal consent to perform incision and drainage.  Digital block was performed by me with plain lidocaine.  The thumb was prepped with Betadine and draped in usual sterile fashion.  The area of desquamated skin was excised with scissors, revealing a small punctate 2 to 3 mm opening where the abscess was communicating from the subcutaneous space into the intradermal space.  This was extended proximally and distally in a zigzag fashion and gross pus was encountered.  Cultures were obtained.  Spreading dissection was carried out in the subcutaneous plane both proximally and distally and any gross purulence from the side of the palm was milked out of the opening.  The wound was then copiously irrigated and packed with a wet 2 x 2 sponge, to keep the wound open and draining until biological progress in fighting the infection has occurred.  It would appear that the surgical portion of his care has concluded.  I will provide orders for nursing to pull the packing Wednesday morning and begin wound care.  Cultures have been obtained, and can be used to assist in antibiotic management.  Please reconsult me for any further issues that may arise requiring surgical intervention.  Cliffton Astersavid A. Janee Mornhompson, MD      Orthopaedic & Hand Surgery [facility] [address] [place], [place]  [id number] Office:  [phone] Mobile: [phone]  09/02/2018, 5:25 AM

## 2018-09-03 DIAGNOSIS — F1910 Other psychoactive substance abuse, uncomplicated: Secondary | ICD-10-CM

## 2018-09-03 LAB — BASIC METABOLIC PANEL
Anion gap: 8 (ref 5–15)
BUN: 5 mg/dL — AB (ref 6–20)
CHLORIDE: 103 mmol/L (ref 98–111)
CO2: 24 mmol/L (ref 22–32)
CREATININE: 0.8 mg/dL (ref 0.61–1.24)
Calcium: 8.8 mg/dL — ABNORMAL LOW (ref 8.9–10.3)
GFR calc Af Amer: >60 mL/min (ref >60)
GFR calc non Af Amer: >60 mL/min (ref >60)
GLUCOSE: 109 mg/dL — AB (ref 70–99)
Potassium: 3.9 mmol/L (ref 3.5–5.1)
SODIUM: 135 mmol/L (ref 135–145)

## 2018-09-03 LAB — CBC
HEMATOCRIT: 40.9 % (ref 39.0–52.0)
Hemoglobin: 13.6 g/dL (ref 13.0–17.0)
MCH: 30.6 pg (ref 26.0–34.0)
MCHC: 33.3 g/dL (ref 30.0–36.0)
MCV: 91.9 fL (ref 80.0–100.0)
Platelets: 256 10*3/uL (ref 150–400)
RBC: 4.45 MIL/uL (ref 4.22–5.81)
RDW: 12 % (ref 11.5–15.5)
WBC: 8.9 10*3/uL (ref 4.0–10.5)
nRBC: 0 % (ref 0.0–0.2)

## 2018-09-03 LAB — HIV ANTIBODY (ROUTINE TESTING W REFLEX): HIV SCREEN 4TH GENERATION: NONREACTIVE

## 2018-09-03 NOTE — Progress Notes (Deleted)
Lab just called with a critical lab value: hemoglobin 6.7.  Will send message through Amion to on-call MD.

## 2018-09-03 NOTE — Progress Notes (Signed)
PROGRESS NOTE    Troy ChuLuke C Yagi  ZOX:[id number] DOB: 11/24/1980 DOA: 09/02/2018 PCP: Patient, No Pcp Per    Brief Narrative: Troy [name] is a 37 y.o. male with medical history significant of schizophrenia presenting with hand pain.  Was found to have cellulitis and abscess of the right hand.  Dr. Janee Mornhompson with hand surgery consulted and he also underwent bedside I&D in ER.  The antibiotics with IV vancomycin and IV Zosyn.  Assessment & Plan:   Principal Problem:   Cellulitis of hand Active Problems:   Schizoaffective disorder, bipolar type (HCC)   Polysubstance abuse (HCC)   Tobacco dependence  Right hand cellulitis Failed outpatient treatment Admitted for IV antibiotics.  Patient was found to have significant thumb swelling concerning for abscess with surrounding edema and erythema.  So far no evidence of sepsis. Hand surgery consulted plan for or tomorrow. Currently on IV vancomycin and IV Zosyn and pain control with IV morphine.  Currently afebrile and WBC count has normalized.    Schizoaffective disorder Will resume saphris    Polysubstance abuse counseling and encouraged    DVT prophylaxis:  Code Status: Full code Family Communication: None at bedside disposition Plan: Pending clinical improvement  Consultants:  Hand surgery Dr. Janee Mornhompson   Procedures: I&D in ER  Antimicrobials: IV vancomycin and IV Zosyn since admission  Subjective: Patient reports pain is suboptimally controlled.  Reports he is expressing pulse from the thumb  Objective: Vitals:   09/02/18 0811 09/02/18 1626 09/02/18 2045 09/03/18 0419  BP: (!) 144/101 131/88 137/85 139/86  Pulse: 81 88 81 83  Resp:  16 20 20   Temp: 98.8 F (37.1 C) 97.9 F (36.6 C) 98.6 F (37 C) 98.6 F (37 C)  TempSrc: Oral Oral Oral Oral  SpO2: 98% 99% 96% 97%  Weight:      Height:        Intake/Output Summary (Last 24 hours) at 09/03/2018 1320 Last data filed at 09/03/2018 0700 Gross per 24 hour    Intake 1352.11 ml  Output 1500 ml  Net -147.89 ml   Filed Weights   09/02/18 0312  Weight: 74.8 kg    Examination:  General exam: Appears calm and comfortable  Respiratory system: Clear to auscultation. Respiratory effort normal. Cardiovascular system: S1 & S2 heard, RRR. No JVD,  No pedal edema. Gastrointestinal system: Abdomen is nondistended, soft and nontender.  Normal bowel sounds heard. Central nervous system: Alert and oriented. No focal neurological deficits. Extremities: Purulent filled  right thumb swollen with surrounding erythema. Skin: No rashes, lesions or ulcers Psychiatry:  Mood & affect appropriate.     Data Reviewed: I have personally reviewed following labs and imaging studies  CBC: Recent Labs  Lab 09/02/18 0336 09/03/18 0302  WBC 14.9* 8.9  NEUTROABS 11.8*  --   HGB 14.7 13.6  HCT 43.9 40.9  MCV 90.3 91.9  PLT 377 256   Basic Metabolic Panel: Recent Labs  Lab 09/02/18 0336 09/03/18 0302  NA 131* 135  K 3.6 3.9  CL 98 103  CO2 21* 24  GLUCOSE 129* 109*  BUN 7 5*  CREATININE 0.98 0.80  CALCIUM 9.5 8.8*   GFR: Estimated Creatinine Clearance: 133.1 mL/min (by C-G formula based on SCr of 0.8 mg/dL). Liver Function Tests: No results for input(s): AST, ALT, ALKPHOS, BILITOT, PROT, ALBUMIN in the last 168 hours. No results for input(s): LIPASE, AMYLASE in the last 168 hours. No results for input(s): AMMONIA in the last 168 hours. Coagulation Profile:  No results for input(s): INR, PROTIME in the last 168 hours. Cardiac Enzymes: No results for input(s): CKTOTAL, CKMB, CKMBINDEX, TROPONINI in the last 168 hours. BNP (last 3 results) No results for input(s): PROBNP in the last 8760 hours. HbA1C: No results for input(s): HGBA1C in the last 72 hours. CBG: No results for input(s): GLUCAP in the last 168 hours. Lipid Profile: No results for input(s): CHOL, HDL, LDLCALC, TRIG, CHOLHDL, LDLDIRECT in the last 72 hours. Thyroid Function  Tests: No results for input(s): TSH, T4TOTAL, FREET4, T3FREE, THYROIDAB in the last 72 hours. Anemia Panel: No results for input(s): VITAMINB12, FOLATE, FERRITIN, TIBC, IRON, RETICCTPCT in the last 72 hours. Sepsis Labs: Recent Labs  Lab 09/02/18 0352 09/02/18 0412  LATICACIDVEN 1.29 0.9    Recent Results (from the past 240 hour(s))  Aerobic Culture (superficial specimen)     Status: None (Preliminary result)   Collection Time: 09/02/18  6:10 AM  Result Value Ref Range Status   Specimen Description WOUND  Final   Special Requests NONE  Final   Gram Stain   Final    RARE WBC PRESENT, PREDOMINANTLY PMN MODERATE GRAM POSITIVE COCCI IN CLUSTERS    Culture   Final    CULTURE REINCUBATED FOR BETTER GROWTH Performed at [facility], [address] [place], [place] [address]    Report Status PENDING  Incomplete         Radiology Studies: No results found.      Scheduled Meds: . Asenapine Maleate  10 mg Sublingual QHS  . Asenapine Maleate  5 mg Sublingual q morning - 10a  . bacitracin   Topical BID  . docusate sodium  100 mg Oral BID  . gabapentin  300 mg Oral TID  . nicotine  14 mg Transdermal Daily  . Valbenazine Tosylate  80 mg Oral Daily   Continuous Infusions: . lactated ringers Stopped (09/03/18 0137)  . piperacillin-tazobactam (ZOSYN)  IV 3.375 g (09/03/18 1154)  . vancomycin 1,500 mg (09/03/18 0637)     LOS: 1 day    Time spent: 35 minutes.     Kathlen Mody, MD [facility] Pager [phone]   If 7PM-7AM, please contact night-coverage [email or web address] Password [email or web address] 09/03/2018, 1:20 PM

## 2018-09-03 NOTE — Progress Notes (Signed)
ED bedside I&D 11-19  Resting comfortably in bed, pain still generous  T98.6, WBC 8.9 Packing pulled, purulent fluid drained and further expressed Still generously swollen, reddened, etc Thumb in full extension  PLAN: Begin BID wound care Continue infection management per primary team VTE proph = SCDs I am making NPO after MN tonite with plan to re-eval in am to determine if to take to OR for another drainage/irrigation procedure  Neil Crouchave Jerzee Jerome, MD Hand surgery

## 2018-09-04 ENCOUNTER — Inpatient Hospital Stay (HOSPITAL_COMMUNITY): Payer: Medicaid Other | Admitting: Certified Registered Nurse Anesthetist

## 2018-09-04 ENCOUNTER — Inpatient Hospital Stay (HOSPITAL_COMMUNITY): Payer: Medicaid Other

## 2018-09-04 ENCOUNTER — Encounter (HOSPITAL_COMMUNITY): Payer: Self-pay

## 2018-09-04 ENCOUNTER — Encounter (HOSPITAL_COMMUNITY)
Admission: EM | Disposition: A | Discharge status: Home / Self Care | Payer: Self-pay | Source: Home / Self Care | Attending: Internal Medicine

## 2018-09-04 HISTORY — PX: I&D EXTREMITY: SHX5045

## 2018-09-04 LAB — VANCOMYCIN, TROUGH: Vancomycin Tr: 12 ug/mL — ABNORMAL LOW (ref 15–20)

## 2018-09-04 LAB — CBC WITH DIFFERENTIAL/PLATELET
ABS IMMATURE GRANULOCYTES: 0.02 10*3/uL (ref 0.00–0.07)
BASOS PCT: 1 %
Basophils Absolute: 0 10*3/uL (ref 0.0–0.1)
EOS PCT: 2 %
Eosinophils Absolute: 0.2 10*3/uL (ref 0.0–0.5)
HCT: 39.2 % (ref 39.0–52.0)
Hemoglobin: 13 g/dL (ref 13.0–17.0)
Immature Granulocytes: 0 %
Lymphocytes Relative: 33 %
Lymphs Abs: 2.4 10*3/uL (ref 0.7–4.0)
MCH: 30.1 pg (ref 26.0–34.0)
MCHC: 33.2 g/dL (ref 30.0–36.0)
MCV: 90.7 fL (ref 80.0–100.0)
MONO ABS: 1 10*3/uL (ref 0.1–1.0)
MONOS PCT: 14 %
NEUTROS ABS: 3.6 10*3/uL (ref 1.7–7.7)
Neutrophils Relative %: 50 %
Platelets: 337 10*3/uL (ref 150–400)
RBC: 4.32 MIL/uL (ref 4.22–5.81)
RDW: 11.8 % (ref 11.5–15.5)
WBC: 7.2 10*3/uL (ref 4.0–10.5)
nRBC: 0 % (ref 0.0–0.2)

## 2018-09-04 LAB — SURGICAL PCR SCREEN
MRSA, PCR: POSITIVE — AB
Staphylococcus aureus: POSITIVE — AB

## 2018-09-04 SURGERY — IRRIGATION AND DEBRIDEMENT EXTREMITY
Anesthesia: General | Laterality: Right

## 2018-09-04 MED ORDER — LACTATED RINGERS IV SOLN
INTRAVENOUS | Status: DC | PRN
  Administered 2018-09-04: 18:00:00 via INTRAVENOUS

## 2018-09-04 MED ORDER — GADOBUTROL 1 MMOL/ML IV SOLN
7.5000 mL | Freq: Once | INTRAVENOUS | Status: AC | PRN
  Administered 2018-09-04: 7.5 mL via INTRAVENOUS

## 2018-09-04 MED ORDER — PROPOFOL 10 MG/ML IV BOLUS
INTRAVENOUS | Status: AC
  Filled 2018-09-04: qty 20

## 2018-09-04 MED ORDER — ACETAMINOPHEN 500 MG PO TABS: 1000.0000 mg | ORAL_TABLET | Freq: Once | ORAL | Status: DC | PRN

## 2018-09-04 MED ORDER — FENTANYL CITRATE (PF) 250 MCG/5ML IJ SOLN
INTRAMUSCULAR | Status: AC
  Filled 2018-09-04: qty 5

## 2018-09-04 MED ORDER — ACETAMINOPHEN 10 MG/ML IV SOLN
1000.0000 mg | Freq: Once | INTRAVENOUS | Status: DC | PRN
  Administered 2018-09-04: 1000 mg via INTRAVENOUS

## 2018-09-04 MED ORDER — FENTANYL CITRATE (PF) 100 MCG/2ML IJ SOLN
25.0000 ug | INTRAMUSCULAR | Status: DC | PRN
  Administered 2018-09-04 (×4): 50 ug via INTRAVENOUS

## 2018-09-04 MED ORDER — MIDAZOLAM HCL 2 MG/2ML IJ SOLN
INTRAMUSCULAR | Status: AC
  Filled 2018-09-04: qty 2

## 2018-09-04 MED ORDER — ONDANSETRON HCL 4 MG/2ML IJ SOLN
INTRAMUSCULAR | Status: DC | PRN
  Administered 2018-09-04: 4 mg via INTRAVENOUS

## 2018-09-04 MED ORDER — OXYCODONE HCL 5 MG PO TABS
5.0000 mg | ORAL_TABLET | Freq: Once | ORAL | Status: AC | PRN
  Administered 2018-09-04: 5 mg via ORAL

## 2018-09-04 MED ORDER — ROCURONIUM BROMIDE 50 MG/5ML IV SOSY
PREFILLED_SYRINGE | INTRAVENOUS | Status: AC
  Filled 2018-09-04: qty 5

## 2018-09-04 MED ORDER — OXYCODONE HCL 5 MG PO TABS
ORAL_TABLET | ORAL | Status: AC
  Filled 2018-09-04: qty 1

## 2018-09-04 MED ORDER — LIDOCAINE 2% (20 MG/ML) 5 ML SYRINGE
INTRAMUSCULAR | Status: AC
  Filled 2018-09-04: qty 5

## 2018-09-04 MED ORDER — MUPIROCIN 2 % EX OINT
1.0000 "application " | TOPICAL_OINTMENT | Freq: Two times a day (BID) | CUTANEOUS | Status: DC
  Administered 2018-09-04 – 2018-09-08 (×9): 1 via NASAL
  Filled 2018-09-04 (×2): qty 22

## 2018-09-04 MED ORDER — DEXAMETHASONE SODIUM PHOSPHATE 10 MG/ML IJ SOLN
INTRAMUSCULAR | Status: DC | PRN
  Administered 2018-09-04: 10 mg via INTRAVENOUS

## 2018-09-04 MED ORDER — ACETAMINOPHEN 160 MG/5ML PO SOLN: 1000.0000 mg | Freq: Once | ORAL | Status: DC | PRN

## 2018-09-04 MED ORDER — FENTANYL CITRATE (PF) 100 MCG/2ML IJ SOLN
INTRAMUSCULAR | Status: DC | PRN
  Administered 2018-09-04 (×5): 25 ug via INTRAVENOUS

## 2018-09-04 MED ORDER — PROPOFOL 10 MG/ML IV BOLUS
INTRAVENOUS | Status: DC | PRN
  Administered 2018-09-04: 200 mg via INTRAVENOUS
  Administered 2018-09-04: 30 mg via INTRAVENOUS
  Administered 2018-09-04: 20 mg via INTRAVENOUS

## 2018-09-04 MED ORDER — SODIUM CHLORIDE 0.9 % IV SOLN
INTRAVENOUS | Status: DC
  Filled 2018-09-04: qty 100

## 2018-09-04 MED ORDER — ONDANSETRON HCL 4 MG/2ML IJ SOLN
INTRAMUSCULAR | Status: AC
  Filled 2018-09-04: qty 2

## 2018-09-04 MED ORDER — SODIUM CHLORIDE 0.9 % IR SOLN
Status: DC | PRN
  Administered 2018-09-04: 3000 mL

## 2018-09-04 MED ORDER — 0.9 % SODIUM CHLORIDE (POUR BTL) OPTIME
TOPICAL | Status: DC | PRN
  Administered 2018-09-04: 1000 mL

## 2018-09-04 MED ORDER — MIDAZOLAM HCL 5 MG/5ML IJ SOLN
INTRAMUSCULAR | Status: DC | PRN
  Administered 2018-09-04: 2 mg via INTRAVENOUS

## 2018-09-04 MED ORDER — DEXAMETHASONE SODIUM PHOSPHATE 10 MG/ML IJ SOLN
INTRAMUSCULAR | Status: AC
  Filled 2018-09-04: qty 1

## 2018-09-04 MED ORDER — CHLORHEXIDINE GLUCONATE CLOTH 2 % EX PADS
6.0000 | MEDICATED_PAD | Freq: Every day | CUTANEOUS | Status: DC
  Administered 2018-09-04 – 2018-09-08 (×4): 6 via TOPICAL

## 2018-09-04 MED ORDER — FENTANYL CITRATE (PF) 100 MCG/2ML IJ SOLN
INTRAMUSCULAR | Status: AC
  Administered 2018-09-04: 50 ug via INTRAVENOUS
  Filled 2018-09-04: qty 2

## 2018-09-04 MED ORDER — LACTATED RINGERS IV SOLN
INTRAVENOUS | Status: DC
  Administered 2018-09-05: 23:00:00 via INTRAVENOUS

## 2018-09-04 MED ORDER — LIDOCAINE 2% (20 MG/ML) 5 ML SYRINGE
INTRAMUSCULAR | Status: DC | PRN
  Administered 2018-09-04: 60 mg via INTRAVENOUS

## 2018-09-04 MED ORDER — SODIUM CHLORIDE 0.9 % IV SOLN
INTRAVENOUS | Status: AC
  Administered 2018-09-04: 23:00:00
  Filled 2018-09-04 (×2): qty 100

## 2018-09-04 MED ORDER — ACETAMINOPHEN 10 MG/ML IV SOLN
INTRAVENOUS | Status: AC
  Administered 2018-09-04: 1000 mg via INTRAVENOUS
  Filled 2018-09-04: qty 100

## 2018-09-04 MED ORDER — OXYCODONE HCL 5 MG/5ML PO SOLN: 5.0000 mg | Freq: Once | ORAL | Status: AC | PRN

## 2018-09-04 SURGICAL SUPPLY — 49 items
BANDAGE COBAN STERILE 2 (GAUZE/BANDAGES/DRESSINGS) IMPLANT
BLADE MINI RND TIP GREEN BEAV (BLADE) IMPLANT
BLADE SURG 15 STRL LF DISP TIS (BLADE) ×1 IMPLANT
BLADE SURG 15 STRL SS (BLADE) ×3
BNDG COHESIVE 4X5 TAN STRL (GAUZE/BANDAGES/DRESSINGS) ×3 IMPLANT
BNDG GAUZE ELAST 4 BULKY (GAUZE/BANDAGES/DRESSINGS) ×4 IMPLANT
CHLORAPREP W/TINT 26ML (MISCELLANEOUS) ×3 IMPLANT
CORDS BIPOLAR (ELECTRODE) IMPLANT
COVER BACK TABLE 60X90IN (DRAPES) ×3 IMPLANT
COVER MAYO STAND STRL (DRAPES) ×3 IMPLANT
COVER WAND RF STERILE (DRAPES) ×3 IMPLANT
CUFF TOURNIQUET SINGLE 18IN (TOURNIQUET CUFF) ×2 IMPLANT
DRAPE HALF SHEET 40X57 (DRAPES) ×3 IMPLANT
DRAPE SURG 17X23 STRL (DRAPES) ×3 IMPLANT
DRSG EMULSION OIL 3X3 NADH (GAUZE/BANDAGES/DRESSINGS) ×3 IMPLANT
GAUZE SPONGE 4X4 12PLY STRL (GAUZE/BANDAGES/DRESSINGS) ×3 IMPLANT
GLOVE BIO SURGEON STRL SZ7.5 (GLOVE) ×3 IMPLANT
GLOVE BIOGEL PI IND STRL 7.0 (GLOVE) ×1 IMPLANT
GLOVE BIOGEL PI IND STRL 8 (GLOVE) ×1 IMPLANT
GLOVE BIOGEL PI INDICATOR 7.0 (GLOVE) ×2
GLOVE BIOGEL PI INDICATOR 8 (GLOVE) ×2
GLOVE ECLIPSE 6.5 STRL STRAW (GLOVE) ×3 IMPLANT
GOWN STRL REUS W/ TWL LRG LVL3 (GOWN DISPOSABLE) ×2 IMPLANT
GOWN STRL REUS W/TWL LRG LVL3 (GOWN DISPOSABLE) ×6
GOWN STRL REUS W/TWL XL LVL3 (GOWN DISPOSABLE) ×5 IMPLANT
NDL HYPO 25X1 1.5 SAFETY (NEEDLE) IMPLANT
NEEDLE HYPO 25X1 1.5 SAFETY (NEEDLE) IMPLANT
NS IRRIG 1000ML POUR BTL (IV SOLUTION) ×3 IMPLANT
PACK BASIN DAY SURGERY FS (CUSTOM PROCEDURE TRAY) ×3 IMPLANT
PACK ORTHO EXTREMITY (CUSTOM PROCEDURE TRAY) ×2 IMPLANT
PAD CAST 4YDX4 CTTN HI CHSV (CAST SUPPLIES) IMPLANT
PADDING CAST ABS 4INX4YD NS (CAST SUPPLIES)
PADDING CAST ABS COTTON 4X4 ST (CAST SUPPLIES) IMPLANT
PADDING CAST COTTON 4X4 STRL (CAST SUPPLIES) ×3
RUBBERBAND STERILE (MISCELLANEOUS) IMPLANT
SET IRRIG Y TYPE TUR BLADDER L (SET/KITS/TRAYS/PACK) ×2 IMPLANT
SOLUTION BETADINE 4OZ (MISCELLANEOUS) ×2 IMPLANT
STOCKINETTE 6  STRL (DRAPES) ×2
STOCKINETTE 6 STRL (DRAPES) ×1 IMPLANT
SUT VIC AB 0 CT1 27 (SUTURE) ×3
SUT VIC AB 0 CT1 27XBRD ANBCTR (SUTURE) IMPLANT
SUT VICRYL RAPIDE 4-0 (SUTURE) IMPLANT
SUT VICRYL RAPIDE 4/0 PS 2 (SUTURE) ×2 IMPLANT
SYR 10ML LL (SYRINGE) IMPLANT
SYR BULB 3OZ (MISCELLANEOUS) ×3 IMPLANT
TOWEL OR 17X24 6PK STRL BLUE (TOWEL DISPOSABLE) ×3 IMPLANT
TOWEL OR NON WOVEN STRL DISP B (DISPOSABLE) ×3 IMPLANT
TUBE FEEDING 8FR 16IN STR KANG (MISCELLANEOUS) ×2 IMPLANT
UNDERPAD 30X30 (UNDERPADS AND DIAPERS) ×3 IMPLANT

## 2018-09-04 NOTE — Progress Notes (Signed)
1545 Pt to short stay via bed for OR at 1645. Pt's mother is aware.

## 2018-09-04 NOTE — Anesthesia Procedure Notes (Signed)
Procedure Name: LMA Insertion Performed by: Anyla Israelson H, CRNA Pre-anesthesia Checklist: Patient identified, Emergency Drugs available, Suction available and Patient being monitored Patient Re-evaluated:Patient Re-evaluated prior to induction Oxygen Delivery Method: Circle System Utilized Preoxygenation: Pre-oxygenation with 100% oxygen Induction Type: IV induction Ventilation: Mask ventilation without difficulty LMA: LMA inserted LMA Size: 5.0 Number of attempts: 1 Airway Equipment and Method: Bite block Placement Confirmation: positive ETCO2 Tube secured with: Tape Dental Injury: Teeth and Oropharynx as per pre-operative assessment        

## 2018-09-04 NOTE — Op Note (Signed)
09/04/2018  6:53 PM  PATIENT:  Troy [name]  37 y.o. male  PRE-OPERATIVE DIAGNOSIS:  Right thumb/hand infection  POST-OPERATIVE DIAGNOSIS:  Same, with FPL tenosynovitis  PROCEDURE:   1. Excisional debridement of right thumb skin/subcutaneous tissues    2. Flexor tenosynovectomy at the wrist    3. Irrigation of FPL sheath  SURGEON: Cliffton Astersavid A. Janee Mornhompson, MD  PHYSICIAN ASSISTANT: None   ANESTHESIA:  general  SPECIMENS:  None  DRAINS:   None--pediatric feeding tube irrigation catheter left in place  EBL:  less than 50 mL  PREOPERATIVE INDICATIONS:  Troy ChuLuke C Zaccone is a  37 y.o. male with right hand/thumb infection, MRI c/w tenosynovitis extending to wrist  The risks benefits and alternatives were discussed with the patient preoperatively including but not limited to the risks of infection, bleeding, nerve injury, cardiopulmonary complications, the need for revision surgery, among others, and the patient verbalized understanding and consented to proceed.  OPERATIVE IMPLANTS: none  OPERATIVE PROCEDURE:  After receiving prophylactic antibiotics, the patient was escorted to the operative theatre and placed in a supine position.  General anesthesia was administered.  A surgical "time-out" was performed during which the planned procedure, proposed operative site, and the correct patient identity were compared to the operative consent and agreement confirmed by the circulating nurse according to current facility policy.  Following application of a tourniquet to the operative extremity, the exposed skin was prepped with Betadine and draped in the usual sterile fashion.  The limb was exsanguinated with gravity and the tourniquet inflated to approximately 100mmHg higher than systolic BP.  A 2 limb zigzag incision was made at the volar aspect of the distal forearm, crossing the flexion creases obliquely.  Subcutaneous tissues were dissected with blunt and spreading dissection.  The FCR axis was  exploited deeply.  The flexor tendons at this level were found to have thickened and swollen tenosynovium, unclear whether this was infectious or reactive, but flexor tenosynovectomy was performed, including the FPL as well as the other surrounding flexor tendons.  Attention was shifted to the thumb itself, where excisional debridement was performed of some additional desquamated epidermis, as well as nonviable skin edges at the site of the previous incision and drainage.  The skin was rather soft and mushy and the opening was extended proximally along the course of the proximal phalanx.  Seropurulent fluid was encountered.  This was irrigated till it was cleaned.  Additionally using a pediatric feeding tube, the path of the FPL was irrigated with the feeding tube and irrigant flowed nicely out of the open wound of the thumb.  Irrigation was run until it was clean.  The tourniquet was released, additional hemostasis was unnecessary and the wrist incision was closed with 4-0 Vicryl Rapide interrupted suture.  The irrigation catheter was left in place and secured with the same type suture.  The thumb wound was left open, and a bulky hand dressing with a volar plaster component was applied.  Irrigant could easily be pushed through the irrigation catheter and exiting the open wound of the thumb.  He was awakened and taken to the recovery room in stable condition, breathing spontaneously  DISPOSITION: He will return to the floor for postoperative care.  My intention would be to continue irrigation through the catheter slowly and continuously, and will write orders for this to contain gentamicin.  I will likely pull the catheter after 24 to 36 hours.

## 2018-09-04 NOTE — Transfer of Care (Signed)
Immediate Anesthesia Transfer of Care Note  Patient: Troy [name]  Procedure(s) Performed: IRRIGATION AND DEBRIDEMENT OF  HAND (Right )  Patient Location: PACU  Anesthesia Type:General  Level of Consciousness: awake, alert  and oriented  Airway & Oxygen Therapy: Patient Spontanous Breathing and Patient connected to face mask oxygen  Post-op Assessment: Report given to RN and Post -op Vital signs reviewed and stable  Post vital signs: Reviewed and stable  Last Vitals:  Vitals Value Taken Time  BP 165/90 09/04/2018  6:52 PM  Temp    Pulse 85 09/04/2018  6:52 PM  Resp 12 09/04/2018  6:52 PM  SpO2 100 % 09/04/2018  6:52 PM  Vitals shown include unvalidated device data.  Last Pain:  Vitals:   09/04/18 1604  TempSrc: Oral  PainSc:       Patients Stated Pain Goal: 0 (09/02/18 2223)  Complications: No apparent anesthesia complications

## 2018-09-04 NOTE — Anesthesia Postprocedure Evaluation (Signed)
Anesthesia Post Note  Patient: Troy [name]  Procedure(s) Performed: IRRIGATION AND DEBRIDEMENT OF  HAND (Right )     Patient location during evaluation: PACU Anesthesia Type: General Level of consciousness: sedated and patient cooperative Pain management: pain level controlled Vital Signs Assessment: post-procedure vital signs reviewed and stable Respiratory status: spontaneous breathing Cardiovascular status: stable Anesthetic complications: no    Last Vitals:  Vitals:   09/04/18 1934 09/04/18 1952  BP: (!) 143/98 (!) 137/96  Pulse: 81 67  Resp: 13 13  Temp:    SpO2: 99% 99%    Last Pain:  Vitals:   09/04/18 1956  TempSrc:   PainSc: 8                  Lewie LoronJohn Gloristine Turrubiates

## 2018-09-04 NOTE — Progress Notes (Addendum)
ED bedside I&D 11-19  Resting in bed  Tmax 99.4, WBC down to 7.2, Gm St  with GPC in clusters, cx still pending Still with expressable purulence, additional area of proximal desquamation However, more prox redness and swelling has decreased slightly Thumb in full extension  PLAN: Begin BID wound care Continue infection management per primary team VTE proph = SCDs I am ordering stat MRI to eval for skip lesions/abscess not communicating with present opening in skin I am also placing him on OR add-on list for late afternoon, pending results of MRI and clinical course over today, so needs  TO REMAIN NPO WITH NO LOVENOX, ETC.  Neil Crouchave Sevag Shearn, MD Hand surgery

## 2018-09-04 NOTE — Anesthesia Preprocedure Evaluation (Addendum)
Anesthesia Evaluation  Patient identified by MRN, date of birth, ID band Patient awake    Reviewed: Allergy & Precautions, NPO status , Patient's Chart, lab work & pertinent test results  Airway Mallampati: II  TM Distance: >3 FB Neck ROM: Full    Dental  (+) Dental Advisory Given   Pulmonary Current Smoker,    breath sounds clear to auscultation       Cardiovascular negative cardio ROS   Rhythm:Regular Rate:Normal     Neuro/Psych Anxiety Bipolar Disorder Schizophrenia negative neurological ROS     GI/Hepatic negative GI ROS, (+)     substance abuse  marijuana use and methamphetamine use,   Endo/Other  negative endocrine ROS  Renal/GU negative Renal ROS     Musculoskeletal  (+) narcotic dependentCellulitis and abscess of right hand   Abdominal   Peds  Hematology negative hematology ROS (+)   Anesthesia Other Findings   Reproductive/Obstetrics                            Lab Results  Component Value Date   WBC 7.2 09/04/2018   HGB 13.0 09/04/2018   HCT 39.2 09/04/2018   MCV 90.7 09/04/2018   PLT 337 09/04/2018   Lab Results  Component Value Date   CREATININE 0.80 09/03/2018   BUN 5 (L) 09/03/2018   NA 135 09/03/2018   K 3.9 09/03/2018   CL 103 09/03/2018   CO2 24 09/03/2018    Anesthesia Physical Anesthesia Plan  ASA: II  Anesthesia Plan: General   Post-op Pain Management:    Induction: Intravenous  PONV Risk Score and Plan: 1 and Ondansetron, Dexamethasone and Treatment may vary due to age or medical condition  Airway Management Planned: LMA  Additional Equipment:   Intra-op Plan:   Post-operative Plan: Extubation in OR  Informed Consent: I have reviewed the patients History and Physical, chart, labs and discussed the procedure including the risks, benefits and alternatives for the proposed anesthesia with the patient or authorized representative who has  indicated his/her understanding and acceptance.   Dental advisory given  Plan Discussed with: CRNA  Anesthesia Plan Comments:         Anesthesia Quick Evaluation

## 2018-09-04 NOTE — Progress Notes (Signed)
PROGRESS NOTE    Troy ChuLuke C Brake  JYN:[id number]:[id number] DOB: 10/28/1980 DOA: 09/02/2018 PCP: Patient, No Pcp Per    Brief Narrative: Troy [name] is a 37 y.o. male with medical history significant of schizophrenia presenting with hand pain.  Was found to have cellulitis and abscess of the right hand.  Dr. Janee Mornhompson with hand surgery consulted and he also underwent bedside I&D in ER and cultures growing abundant staph species. He was started on antibiotics with IV vancomycin and IV Zosyn.  Assessment & Plan:   Principal Problem:   Cellulitis of hand Active Problems:   Schizoaffective disorder, bipolar type (HCC)   Polysubstance abuse (HCC)   Tobacco dependence  Right hand cellulitis Failed outpatient treatment s/p I&D in ED: cultures are growing abundant staph.  Admitted for IV antibiotics.  Patient was found to have significant thumb swelling concerning for abscess with surrounding edema and erythema.  So far no evidence of sepsis. Hand surgery consulted plan for OR this afternoon after MRi of the hand.  Currently on IV vancomycin and IV Zosyn and pain control with IV morphine.  Currently afebrile and WBC count has normalized.    Schizoaffective disorder Will resume saphris    Polysubstance abuse counseling and encouraged to stop illicit drug use.     DVT prophylaxis: scd's Code Status: Full code Family Communication: None at bedside  disposition Plan: Pending clinical improvement  Consultants:  Hand surgery Dr. Janee Mornhompson   Procedures: I&D in ER  Antimicrobials: IV vancomycin and IV Zosyn since admission  Subjective: He reports the swelling has improved.    Objective: Vitals:   09/03/18 0419 09/03/18 1350 09/03/18 1927 09/04/18 0400  BP: 139/86 131/81 119/76 131/86  Pulse: 83 96 89 75  Resp: 20  17 17   Temp: 98.6 F (37 C) 99.4 F (37.4 C) 98.2 F (36.8 C) 98.6 F (37 C)  TempSrc: Oral Oral Oral Oral  SpO2: 97% 98% 98% 99%  Weight:      Height:         Intake/Output Summary (Last 24 hours) at 09/04/2018 1457 Last data filed at 09/04/2018 0700 Gross per 24 hour  Intake -  Output 1075 ml  Net -1075 ml   Filed Weights   09/02/18 0312  Weight: 74.8 kg    Examination:  General exam: Appears calm and comfortable not in any kind of distress.  Respiratory system: Clear to auscultation. Respiratory effort normal. No wheezing or rhonchi.  Cardiovascular system: S1 & S2 heard, RRR. No JVD,  No pedal edema. Gastrointestinal system: Abdomen is soft NT nd bs+ Central nervous system: Alert and oriented. No focal neurological deficits. Extremities: right hand swelling improved. Bandaged around the thigh.  Skin: No rashes, lesions or ulcers Psychiatry:  Mood & affect appropriate.     Data Reviewed: I have personally reviewed following labs and imaging studies  CBC: Recent Labs  Lab 09/02/18 0336 09/03/18 0302 09/04/18 0226  WBC 14.9* 8.9 7.2  NEUTROABS 11.8*  --  3.6  HGB 14.7 13.6 13.0  HCT 43.9 40.9 39.2  MCV 90.3 91.9 90.7  PLT 377 256 337   Basic Metabolic Panel: Recent Labs  Lab 09/02/18 0336 09/03/18 0302  NA 131* 135  K 3.6 3.9  CL 98 103  CO2 21* 24  GLUCOSE 129* 109*  BUN 7 5*  CREATININE 0.98 0.80  CALCIUM 9.5 8.8*   GFR: Estimated Creatinine Clearance: 133.1 mL/min (by C-G formula based on SCr of 0.8 mg/dL). Liver Function Tests: No results  for input(s): AST, ALT, ALKPHOS, BILITOT, PROT, ALBUMIN in the last 168 hours. No results for input(s): LIPASE, AMYLASE in the last 168 hours. No results for input(s): AMMONIA in the last 168 hours. Coagulation Profile: No results for input(s): INR, PROTIME in the last 168 hours. Cardiac Enzymes: No results for input(s): CKTOTAL, CKMB, CKMBINDEX, TROPONINI in the last 168 hours. BNP (last 3 results) No results for input(s): PROBNP in the last 8760 hours. HbA1C: No results for input(s): HGBA1C in the last 72 hours. CBG: No results for input(s): GLUCAP in the  last 168 hours. Lipid Profile: No results for input(s): CHOL, HDL, LDLCALC, TRIG, CHOLHDL, LDLDIRECT in the last 72 hours. Thyroid Function Tests: No results for input(s): TSH, T4TOTAL, FREET4, T3FREE, THYROIDAB in the last 72 hours. Anemia Panel: No results for input(s): VITAMINB12, FOLATE, FERRITIN, TIBC, IRON, RETICCTPCT in the last 72 hours. Sepsis Labs: Recent Labs  Lab 09/02/18 0352 09/02/18 0412  LATICACIDVEN 1.29 0.9    Recent Results (from the past 240 hour(s))  Culture, blood (routine x 2)     Status: None (Preliminary result)   Collection Time: 09/02/18  3:25 AM  Result Value Ref Range Status   Specimen Description SITE NOT SPECIFIED  Final   Special Requests   Final    BOTTLES DRAWN AEROBIC AND ANAEROBIC Blood Culture adequate volume   Culture   Final    NO GROWTH 2 DAYS Performed at [facility], [address] [place], [place] [address]    Report Status PENDING  Incomplete  Culture, blood (routine x 2)     Status: None (Preliminary result)   Collection Time: 09/02/18  4:13 AM  Result Value Ref Range Status   Specimen Description BLOOD RIGHT ARM  Final   Special Requests   Final    BOTTLES DRAWN AEROBIC AND ANAEROBIC Blood Culture adequate volume   Culture   Final    NO GROWTH 2 DAYS Performed at [facility], [address], [place], [place] [address]    Report Status PENDING  Incomplete  Aerobic Culture (superficial specimen)     Status: None (Preliminary result)   Collection Time: 09/02/18  6:10 AM  Result Value Ref Range Status   Specimen Description WOUND  Final   Special Requests NONE  Final   Gram Stain   Final    RARE WBC PRESENT, PREDOMINANTLY PMN MODERATE GRAM POSITIVE COCCI IN CLUSTERS    Culture   Final    MODERATE STAPHYLOCOCCUS AUREUS SUSCEPTIBILITIES TO FOLLOW Performed at [facility], [address] [place], [place] [address]    Report Status PENDING  Incomplete  Surgical pcr screen     Status: Abnormal    Collection Time: 09/04/18  2:05 AM  Result Value Ref Range Status   MRSA, PCR POSITIVE (A) NEGATIVE Final    Comment: RESULT CALLED TO, READ BACK BY AND VERIFIED WITHCorky Downs RN [id number] 09/04/18 A BROWNING    Staphylococcus aureus POSITIVE (A) NEGATIVE Final    Comment: (NOTE) The Xpert SA Assay (FDA approved for NASAL specimens in patients 77 years of age and older), is one component of a comprehensive surveillance program. It is not intended to diagnose infection nor to guide or monitor treatment. Performed at [facility], [address], [place], [place] [address]          Radiology Studies: Mr Hand Right W Wo Contrast  Result Date: 09/04/2018 CLINICAL DATA:  Right hand cellulitis and abscess.  Recent incision and drainage over the volar aspect of the thumb IP joint. EXAM: MRI OF THE RIGHT HAND WITHOUT AND WITH CONTRAST TECHNIQUE: Multiplanar, multisequence MR imaging of the right hand was performed before and after the administration of intravenous contrast. CONTRAST:  7.5 mL Gadavist intravenous contrast. COMPARISON:  Right hand x-rays dated August 30, 2018. FINDINGS: Bones/Joint/Cartilage No marrow signal abnormality. No fracture or dislocation. Normal alignment. Moderate thumb IP joint effusion. Ligaments Collateral ligaments are intact. Muscles and Tendons There is abnormal fluid and enhancement surrounding the flexor pollicis longus tendon in the thenar region. Abnormal tendon sheath enhancement extends through the carpal tunnel into the distal forearm. Flexor and extensor compartment tendons are intact. Edema and enhancement of the abductor and flexor pollicis brevis muscles, opponens pollicis muscle, and to lesser extent the adductor pollicis muscle. Soft tissue There is a open skin defect along the volar thumb at the level of the IP joint which communicates with a small amount of rim enhancing fluid overlying the flexor pollicis longus tendon and extending proximally to  the base of the first proximal phalanx. This measures approximately 0.8 x 1.6 x 2.3 cm. There is diffuse soft tissue swelling of the thumb, thenar region, and dorsal hand. IMPRESSION: 1. Open skin defect along the volar thumb at the level of the IP joint which communicates with a 0.8 x 1.6 x 2.3 cm abscess overlying the flexor pollicis longus tendon, extending from the IP joint to the base of the first proximal phalanx. 2. Infectious tenosynovitis of the flexor pollicis longus tendon, with abnormal tendon sheath enhancement extending proximally through the carpal tunnel into the distal forearm. 3. Moderate thumb IP joint effusion. Septic arthritis is not excluded. 4. Thenar myositis. Prominent cellulitis of the thumb, thenar region, and dorsal hand. 5. No evidence of osteomyelitis. Electronically Signed   By: Obie Dredge M.D.   On: 09/04/2018 11:42        Scheduled Meds: . Asenapine Maleate  10 mg Sublingual QHS  . Asenapine Maleate  5 mg Sublingual q morning - 10a  . bacitracin   Topical BID  . Chlorhexidine Gluconate Cloth  6 each Topical Q0600  . docusate sodium  100 mg Oral BID  . gabapentin  300 mg Oral TID  . mupirocin ointment  1 application Nasal BID  . nicotine  14 mg Transdermal Daily  . Valbenazine Tosylate  80 mg Oral Daily   Continuous Infusions: . lactated ringers 75 mL/hr at 09/04/18 0820  . piperacillin-tazobactam (ZOSYN)  IV 3.375 g (09/04/18 1145)  . vancomycin 1,500 mg (09/04/18 0402)     LOS: 2 days    Time spent: 35 minutes.     Kathlen Mody, MD [facility] Pager [phone]   If 7PM-7AM, please contact night-coverage [email or web address] Password [facility] 09/04/2018, 2:56 PM

## 2018-09-04 NOTE — Plan of Care (Signed)

## 2018-09-04 NOTE — Progress Notes (Signed)
Pt is s/p R hand I&D, per Dr. Carollee Massedhompson's order, will continue wound irrigation with normal saline with gentamicin added. The detailed order was clarified with Dr. Janee Mornhompson:  Concentration: 0.5mg /ml Base solution: Normal saline Rate: 655ml/hr Duration: ~ 20 hrs  Order placed with 50 mg Gentamicin in 100 ml 0.9% NaCl give by constant irrigation into wound irrigation catheter at 5mL per hour via IV pump  Thanks!   Bayard HuggerMei Anaira Seay, PharmD, BCPS, BCPPS Clinical Pharmacist  Pager: [phone]

## 2018-09-04 NOTE — Progress Notes (Signed)
Pharmacy Antibiotic Note  Troy [name] is a 37 y.o. male admitted on 09/02/2018 with cellulitis. On D#3 vancomycin and zosyn.  Wbc trending down to 8.9K, afebrile, renal function has been stable. Vancomycin trough = 12 within therapeutic range, drawn at appropriate time. Plan for I&D tonight   Plan: -Continue Vancomycin 1500 mg IV Q 12 hours -Zosyn 3.375 gm IV Q 8 hours per MD -Monitor CBC, renal fx, cultures and clinical progress   Height: 5\' 10"  (177.8 cm) Weight: 165 lb (74.8 kg) IBW/kg (Calculated) : 73  Temp (24hrs), Avg:98.4 F (36.9 C), Min:98.2 F (36.8 C), Max:98.6 F (37 C)  Recent Labs  Lab 09/02/18 0336 09/02/18 0352 09/02/18 0412 09/03/18 0302 09/04/18 0226 09/04/18 1516  WBC 14.9*  --   --  8.9 7.2  --   CREATININE 0.98  --   --  0.80  --   --   LATICACIDVEN  --  1.29 0.9  --   --   --   VANCOTROUGH  --   --   --   --   --  12*    Estimated Creatinine Clearance: 133.1 mL/min (by C-G formula based on SCr of 0.8 mg/dL).    No Known Allergies  Antimicrobials this admission: Vanc 11/19 >>  Zosyn 11/19 >>   Dose adjustments this admission: 11/21: Vancomycin trough = 12 on 1500 mg Q 12 hrs, no change.  11/19 Wound: rare GPCs in clusters  11/29 BCx>> ngtd  11/21 MRSA PCR pos     Thank you for allowing pharmacy to be a part of this patient's care.  Bayard HuggerMei Brandyce Dimario, PharmD, BCPS, BCPPS Clinical Pharmacist  Pager: [phone]   If after 3:30pm, please refer to [facility] for unit-specific pharmacist

## 2018-09-05 ENCOUNTER — Encounter (HOSPITAL_COMMUNITY): Payer: Self-pay | Admitting: Orthopedic Surgery

## 2018-09-05 MED ORDER — OXYCODONE-ACETAMINOPHEN 5-325 MG PO TABS
1.0000 | ORAL_TABLET | ORAL | Status: DC | PRN
  Administered 2018-09-05 – 2018-09-08 (×15): 2 via ORAL
  Filled 2018-09-05 (×15): qty 2

## 2018-09-05 NOTE — Plan of Care (Signed)

## 2018-09-05 NOTE — Plan of Care (Signed)

## 2018-09-05 NOTE — Progress Notes (Signed)
PROGRESS NOTE    Troy [name]  WUJ:[id number] DOB: 10-Aug-1981 DOA: 09/02/2018 PCP: Patient, No Pcp Per    Brief Narrative: Troy [name] is a 37 y.o. male with medical history significant of schizophrenia presenting with hand pain.  Was found to have cellulitis and abscess of the right hand.  Dr. Janee Morn with hand surgery consulted and he also underwent bedside I&D in ER and cultures growing abundant staph species. He was started on antibiotics with IV vancomycin and IV Zosyn.  Assessment & Plan:   Principal Problem:   Cellulitis of hand Active Problems:   Schizoaffective disorder, bipolar type (HCC)   Polysubstance abuse (HCC)   Tobacco dependence  Right hand cellulitis Failed outpatient treatment s/p I&D in ED: cultures are growing abundant staph. Final cultures are pending.  Admitted for IV antibiotics.  Patient was found to have significant thumb swelling concerning for abscess with surrounding edema and erythema.  So far no evidence of sepsis. Hand surgery consulted  Underwent irrigation and debridement on 11/21 and MRI of thehand does not show any osteomyelitis.  Currently on IV vancomycin and IV Zosyn and pain control with oxycodone and breakthrough pain control with  IV morphine.  Currently afebrile and WBC count has normalized.    Schizoaffective disorder Will resume saphris    Polysubstance abuse counseling and encouraged to stop illicit drug use.     DVT prophylaxis: scd's Code Status: Full code Family Communication: discussed with family at bedside.  disposition Plan: Pending clinical improvement  Consultants:  Hand surgery Dr. Janee Morn   Procedures: I&D in ER  Antimicrobials: IV vancomycin and IV Zosyn since admission  Subjective: He reports the swelling has improved. But pain is persistent. No nausea, vomiting or diarrhea.    Objective: Vitals:   09/04/18 1952 09/05/18 0049 09/05/18 0513 09/05/18 1500  BP: (!) 137/96 124/78 125/85 120/70  Pulse:  67 81 83 89  Resp: 13 19 19 18   Temp:  98.4 F (36.9 C) 98.3 F (36.8 C) 98.3 F (36.8 C)  TempSrc:  Oral Oral Oral  SpO2: 99% 97% 96% 98%  Weight:      Height:        Intake/Output Summary (Last 24 hours) at 09/05/2018 1655 Last data filed at 09/05/2018 1500 Gross per 24 hour  Intake 4295.92 ml  Output 1380 ml  Net 2915.92 ml   Filed Weights   09/02/18 0312  Weight: 74.8 kg    Examination:  General exam: mild distress from pain  Respiratory system:clear to auscultation.  Cardiovascular system: S1 & S2 heard, RRR. No JVD,  No pedal edema. Gastrointestinal system: Abdomen is soft non tender non distended bowel sounds good.  Central nervous system: Alert and oriented. No focal neurological deficits. Extremities: forearm bandaged. No pedal edema.  Skin: No rashes, lesions or ulcers Psychiatry:  Mood & affect appropriate.     Data Reviewed: I have personally reviewed following labs and imaging studies  CBC: Recent Labs  Lab 09/02/18 0336 09/03/18 0302 09/04/18 0226  WBC 14.9* 8.9 7.2  NEUTROABS 11.8*  --  3.6  HGB 14.7 13.6 13.0  HCT 43.9 40.9 39.2  MCV 90.3 91.9 90.7  PLT 377 256 337   Basic Metabolic Panel: Recent Labs  Lab 09/02/18 0336 09/03/18 0302  NA 131* 135  K 3.6 3.9  CL 98 103  CO2 21* 24  GLUCOSE 129* 109*  BUN 7 5*  CREATININE 0.98 0.80  CALCIUM 9.5 8.8*   GFR: Estimated Creatinine Clearance: 133.1  mL/min (by C-G formula based on SCr of 0.8 mg/dL). Liver Function Tests: No results for input(s): AST, ALT, ALKPHOS, BILITOT, PROT, ALBUMIN in the last 168 hours. No results for input(s): LIPASE, AMYLASE in the last 168 hours. No results for input(s): AMMONIA in the last 168 hours. Coagulation Profile: No results for input(s): INR, PROTIME in the last 168 hours. Cardiac Enzymes: No results for input(s): CKTOTAL, CKMB, CKMBINDEX, TROPONINI in the last 168 hours. BNP (last 3 results) No results for input(s): PROBNP in the last 8760  hours. HbA1C: No results for input(s): HGBA1C in the last 72 hours. CBG: No results for input(s): GLUCAP in the last 168 hours. Lipid Profile: No results for input(s): CHOL, HDL, LDLCALC, TRIG, CHOLHDL, LDLDIRECT in the last 72 hours. Thyroid Function Tests: No results for input(s): TSH, T4TOTAL, FREET4, T3FREE, THYROIDAB in the last 72 hours. Anemia Panel: No results for input(s): VITAMINB12, FOLATE, FERRITIN, TIBC, IRON, RETICCTPCT in the last 72 hours. Sepsis Labs: Recent Labs  Lab 09/02/18 0352 09/02/18 0412  LATICACIDVEN 1.29 0.9    Recent Results (from the past 240 hour(s))  Culture, blood (routine x 2)     Status: None (Preliminary result)   Collection Time: 09/02/18  3:25 AM  Result Value Ref Range Status   Specimen Description SITE NOT SPECIFIED  Final   Special Requests   Final    BOTTLES DRAWN AEROBIC AND ANAEROBIC Blood Culture adequate volume   Culture   Final    NO GROWTH 3 DAYS Performed at [facility], [address] [place], [place] [address]    Report Status PENDING  Incomplete  Culture, blood (routine x 2)     Status: None (Preliminary result)   Collection Time: 09/02/18  4:13 AM  Result Value Ref Range Status   Specimen Description BLOOD RIGHT ARM  Final   Special Requests   Final    BOTTLES DRAWN AEROBIC AND ANAEROBIC Blood Culture adequate volume   Culture   Final    NO GROWTH 3 DAYS Performed at [facility], [address] [place], [place] [address]    Report Status PENDING  Incomplete  Aerobic Culture (superficial specimen)     Status: None (Preliminary result)   Collection Time: 09/02/18  6:10 AM  Result Value Ref Range Status   Specimen Description WOUND  Final   Special Requests NONE  Final   Gram Stain   Final    RARE WBC PRESENT, PREDOMINANTLY PMN MODERATE GRAM POSITIVE COCCI IN CLUSTERS    Culture   Final    MODERATE STAPHYLOCOCCUS AUREUS SUSCEPTIBILITIES TO FOLLOW Performed at [facility], [address] [place], [place] [address]    Report Status PENDING  Incomplete  Surgical pcr screen     Status: Abnormal   Collection Time: 09/04/18  2:05 AM  Result Value Ref Range Status   MRSA, PCR POSITIVE (A) NEGATIVE Final    Comment: RESULT CALLED TO, READ BACK BY AND VERIFIED WITHCorky Downs: S WHITEHORN RN [phone] 09/04/18 A BROWNING    Staphylococcus aureus POSITIVE (A) NEGATIVE Final    Comment: (NOTE) The Xpert SA Assay (FDA approved for NASAL specimens in patients 35 years of age and older), is one component of a comprehensive surveillance program. It is not intended to diagnose infection nor to guide or monitor treatment. Performed at [facility], [address] [place], [place] [address]          Radiology Studies: Mr Hand Right  W Wo Contrast  Result Date: 09/04/2018 CLINICAL DATA:  Right hand cellulitis and abscess. Recent incision and drainage over the volar aspect of the thumb IP joint. EXAM: MRI OF THE RIGHT HAND WITHOUT AND WITH CONTRAST TECHNIQUE: Multiplanar, multisequence MR imaging of the right hand was performed before and after the administration of intravenous contrast. CONTRAST:  7.5 mL Gadavist intravenous contrast. COMPARISON:  Right hand x-rays dated August 30, 2018. FINDINGS: Bones/Joint/Cartilage No marrow signal abnormality. No fracture or dislocation. Normal alignment. Moderate thumb IP joint effusion. Ligaments Collateral ligaments are intact. Muscles and Tendons There is abnormal fluid and enhancement surrounding the flexor pollicis longus tendon in the thenar region. Abnormal tendon sheath enhancement extends through the carpal tunnel into the distal forearm. Flexor and extensor compartment tendons are intact. Edema and enhancement of the abductor and flexor pollicis brevis muscles, opponens pollicis muscle, and to lesser extent the adductor pollicis muscle. Soft tissue There is a open skin defect along the volar thumb at the level of the IP joint which communicates  with a small amount of rim enhancing fluid overlying the flexor pollicis longus tendon and extending proximally to the base of the first proximal phalanx. This measures approximately 0.8 x 1.6 x 2.3 cm. There is diffuse soft tissue swelling of the thumb, thenar region, and dorsal hand. IMPRESSION: 1. Open skin defect along the volar thumb at the level of the IP joint which communicates with a 0.8 x 1.6 x 2.3 cm abscess overlying the flexor pollicis longus tendon, extending from the IP joint to the base of the first proximal phalanx. 2. Infectious tenosynovitis of the flexor pollicis longus tendon, with abnormal tendon sheath enhancement extending proximally through the carpal tunnel into the distal forearm. 3. Moderate thumb IP joint effusion. Septic arthritis is not excluded. 4. Thenar myositis. Prominent cellulitis of the thumb, thenar region, and dorsal hand. 5. No evidence of osteomyelitis. Electronically Signed   By: Obie Dredge M.D.   On: 09/04/2018 11:42        Scheduled Meds: . Asenapine Maleate  10 mg Sublingual QHS  . Asenapine Maleate  5 mg Sublingual q morning - 10a  . bacitracin   Topical BID  . Chlorhexidine Gluconate Cloth  6 each Topical Q0600  . docusate sodium  100 mg Oral BID  . gabapentin  300 mg Oral TID  . mupirocin ointment  1 application Nasal BID  . nicotine  14 mg Transdermal Daily  . Valbenazine Tosylate  80 mg Oral Daily   Continuous Infusions: . Gentamicin 50mg /171ml in sodium chloride 0.9% for wound IRRIGATION at 24ml/hr 5 mL/hr at 09/04/18 2247  . lactated ringers 75 mL/hr at 09/05/18 1013  . lactated ringers    . piperacillin-tazobactam (ZOSYN)  IV 3.375 g (09/05/18 1015)  . vancomycin 1,500 mg (09/05/18 1556)     LOS: 3 days    Time spent: 35 minutes.     Kathlen Mody, MD [facility] Pager [phone]   If 7PM-7AM, please contact night-coverage [email or web address] Password [facility] 09/05/2018, 4:55 PM

## 2018-09-05 NOTE — Care Management (Signed)
Case manager continuing to follow patient for discharge plan of care.

## 2018-09-06 LAB — AEROBIC CULTURE W GRAM STAIN (SUPERFICIAL SPECIMEN)

## 2018-09-06 LAB — AEROBIC CULTURE  (SUPERFICIAL SPECIMEN)

## 2018-09-06 MED ORDER — BACITRACIN ZINC 500 UNIT/GM EX OINT
TOPICAL_OINTMENT | Freq: Three times a day (TID) | CUTANEOUS | Status: DC
  Administered 2018-09-06: 10:00:00 via TOPICAL
  Administered 2018-09-06: 1 via TOPICAL
  Administered 2018-09-06 – 2018-09-08 (×5): via TOPICAL
  Filled 2018-09-06: qty 28.4

## 2018-09-06 MED ORDER — MORPHINE SULFATE (PF) 2 MG/ML IV SOLN
1.0000 mg | INTRAVENOUS | Status: DC | PRN
  Administered 2018-09-07: 2 mg via INTRAVENOUS
  Filled 2018-09-06: qty 1

## 2018-09-06 NOTE — Progress Notes (Signed)
ED bedside I&D 11-19 OR I&D 11-21 with 24 hours of subsequent gent irrigation  Resting in bed  Afeb, Gm St  with GPC in clusters, cx moderate Staph Aureus, susceptibilities pending  Overall redness and edema improving Thumb swollen, wound stable, no expressible purulence  PLAN: 1. Infection care: antibiotics per primary team.  May need to have ID involved to determine and manage his continued outpatient care since no PCP and hospitalists presently have no outpatient presence  2. Wound management:  Begin TID wound care.  Likely will require no further operative care.  I remain available if any further surgical issues arise.  3. Functional rehab:  Will order OT to start with him today and will order outpatient OT for d/c.   Neil Crouchave Paeton Studer, MD Hand surgery

## 2018-09-06 NOTE — Progress Notes (Signed)
Pt states had large BM

## 2018-09-06 NOTE — Progress Notes (Signed)
PROGRESS NOTE    Troy [name]  ZOX:[id number] DOB: 10-27-1980 DOA: 09/02/2018 PCP: Patient, No Pcp Per    Brief Narrative: Troy [name] is a 37 y.o. male with medical history significant of schizophrenia presenting with hand pain.  Was found to have cellulitis and abscess of the right hand.  Dr. Janee Morn with hand surgery consulted and he also underwent bedside I&D in ER and cultures growing abundant staph species. He was started on antibiotics with IV vancomycin and IV Zosyn. He underwent I&D on 11/21 and his cultures grew MRSA.   Assessment & Plan:   Principal Problem:   Cellulitis of hand Active Problems:   Schizoaffective disorder, bipolar type (HCC)   Polysubstance abuse (HCC)   Tobacco dependence  Right hand cellulitis Failed outpatient treatment s/p I&D in ED: cultures are growing abundant staph. Final cultures grow MRSA. Hand surgery consulted  Underwent irrigation and debridement on 11/21 and MRI of the hand does not show any osteomyelitis.  Currently on IV vancomycin and IV Zosyn, as cultures grew MRSA, zosyn was discontinued. Continue with pain control with oxycodone and breakthrough pain control with  IV morphine.  Currently afebrile and WBC count has normalized. Occupational Therapy evaluation ordered by orthopedics   Schizoaffective disorder Will resume saphris  Polysubstance abuse counseling and encouraged to stop illicit drug use.   Tobacco abuse:  On nicotine patch.  Counseled about cessation.     DVT prophylaxis: scd's Code Status: Full code Family Communication: discussed with family at bedside.  disposition Plan: Pending clinical improvement  Consultants:  Hand surgery Dr. Janee Morn   Procedures: I&D in ER  Antimicrobials: IV vancomycin and IV Zosyn since admission  Subjective: Patient feels all right reports the pain has improved but has not completely resolved. No chest pain no shortness of breath at this time.  Objective: Vitals:   09/05/18 0049 09/05/18 0513 09/05/18 1500 09/06/18 1404  BP: 124/78 125/85 120/70 125/77  Pulse: 81 83 89 67  Resp: 19 19 18 20   Temp: 98.4 F (36.9 C) 98.3 F (36.8 C) 98.3 F (36.8 C) 98.5 F (36.9 C)  TempSrc: Oral Oral Oral Oral  SpO2: 97% 96% 98% 99%  Weight:      Height:        Intake/Output Summary (Last 24 hours) at 09/06/2018 1456 Last data filed at 09/06/2018 1300 Gross per 24 hour  Intake 1320 ml  Output 2040 ml  Net -720 ml   Filed Weights   09/02/18 0312  Weight: 74.8 kg    Examination:  General exam: GEN comfortable, not in any distress Respiratory system: Clear to auscultation, no wheezing or rhonchi Cardiovascular system: S1 & S2 heard, RRR. No JVD,  No pedal edema. Gastrointestinal system: Abdomen is soft, nontender, nondistended with good bowel sounds Central nervous system: Alert and oriented. No focal neurological deficits. Extremities: Right hand bandaged he is able to move his fingers flex and extend them without any issues. Skin: No rashes, lesions or ulcers Psychiatry:  Mood & affect appropriate.     Data Reviewed: I have personally reviewed following labs and imaging studies  CBC: Recent Labs  Lab 09/02/18 0336 09/03/18 0302 09/04/18 0226  WBC 14.9* 8.9 7.2  NEUTROABS 11.8*  --  3.6  HGB 14.7 13.6 13.0  HCT 43.9 40.9 39.2  MCV 90.3 91.9 90.7  PLT 377 256 337   Basic Metabolic Panel: Recent Labs  Lab 09/02/18 0336 09/03/18 0302  NA 131* 135  K 3.6 3.9  CL 98  103  CO2 21* 24  GLUCOSE 129* 109*  BUN 7 5*  CREATININE 0.98 0.80  CALCIUM 9.5 8.8*   GFR: Estimated Creatinine Clearance: 133.1 mL/min (by C-G formula based on SCr of 0.8 mg/dL). Liver Function Tests: No results for input(s): AST, ALT, ALKPHOS, BILITOT, PROT, ALBUMIN in the last 168 hours. No results for input(s): LIPASE, AMYLASE in the last 168 hours. No results for input(s): AMMONIA in the last 168 hours. Coagulation Profile: No results for input(s): INR,  PROTIME in the last 168 hours. Cardiac Enzymes: No results for input(s): CKTOTAL, CKMB, CKMBINDEX, TROPONINI in the last 168 hours. BNP (last 3 results) No results for input(s): PROBNP in the last 8760 hours. HbA1C: No results for input(s): HGBA1C in the last 72 hours. CBG: No results for input(s): GLUCAP in the last 168 hours. Lipid Profile: No results for input(s): CHOL, HDL, LDLCALC, TRIG, CHOLHDL, LDLDIRECT in the last 72 hours. Thyroid Function Tests: No results for input(s): TSH, T4TOTAL, FREET4, T3FREE, THYROIDAB in the last 72 hours. Anemia Panel: No results for input(s): VITAMINB12, FOLATE, FERRITIN, TIBC, IRON, RETICCTPCT in the last 72 hours. Sepsis Labs: Recent Labs  Lab 09/02/18 0352 09/02/18 0412  LATICACIDVEN 1.29 0.9    Recent Results (from the past 240 hour(s))  Culture, blood (routine x 2)     Status: None (Preliminary result)   Collection Time: 09/02/18  3:25 AM  Result Value Ref Range Status   Specimen Description SITE NOT SPECIFIED  Final   Special Requests   Final    BOTTLES DRAWN AEROBIC AND ANAEROBIC Blood Culture adequate volume   Culture   Final    NO GROWTH 4 DAYS Performed at [facility], [address] [place], [place] [address]    Report Status PENDING  Incomplete  Culture, blood (routine x 2)     Status: None (Preliminary result)   Collection Time: 09/02/18  4:13 AM  Result Value Ref Range Status   Specimen Description BLOOD RIGHT ARM  Final   Special Requests   Final    BOTTLES DRAWN AEROBIC AND ANAEROBIC Blood Culture adequate volume   Culture   Final    NO GROWTH 4 DAYS Performed at [facility], [address] [place], [place] [address]    Report Status PENDING  Incomplete  Aerobic Culture (superficial specimen)     Status: None   Collection Time: 09/02/18  6:10 AM  Result Value Ref Range Status   Specimen Description WOUND  Final   Special Requests NONE  Final   Gram Stain   Final    RARE WBC PRESENT,  PREDOMINANTLY PMN MODERATE GRAM POSITIVE COCCI IN CLUSTERS Performed at [facility], [address], [place], [place] [address]    Culture   Final    MODERATE METHICILLIN RESISTANT STAPHYLOCOCCUS AUREUS   Report Status 09/06/2018 FINAL  Final   Organism ID, Bacteria METHICILLIN RESISTANT STAPHYLOCOCCUS AUREUS  Final      Susceptibility   Methicillin resistant staphylococcus aureus - MIC*    CIPROFLOXACIN Value in next row Sensitive      SENSITIVE<=0.5    ERYTHROMYCIN Value in next row Resistant      RESISTANT>=8    GENTAMICIN Value in next row Sensitive      SENSITIVE<=0.5    OXACILLIN Value in next row Resistant      RESISTANT>=4    TETRACYCLINE Value in next row Sensitive      SENSITIVE<=1    VANCOMYCIN Value in  next row Sensitive      SENSITIVE<=0.5    TRIMETH/SULFA Value in next row Sensitive      SENSITIVE<=10    CLINDAMYCIN Value in next row Sensitive      SENSITIVE<=0.25    RIFAMPIN Value in next row Sensitive      SENSITIVE<=0.5    Inducible Clindamycin Value in next row Sensitive      SENSITIVE<=0.5    * MODERATE METHICILLIN RESISTANT STAPHYLOCOCCUS AUREUS  Surgical pcr screen     Status: Abnormal   Collection Time: 09/04/18  2:05 AM  Result Value Ref Range Status   MRSA, PCR POSITIVE (A) NEGATIVE Final    Comment: RESULT CALLED TO, READ BACK BY AND VERIFIED WITHCorky Downs: S WHITEHORN RN [phone] 09/04/18 A BROWNING    Staphylococcus aureus POSITIVE (A) NEGATIVE Final    Comment: (NOTE) The Xpert SA Assay (FDA approved for NASAL specimens in patients 35 years of age and older), is one component of a comprehensive surveillance program. It is not intended to diagnose infection nor to guide or monitor treatment. Performed at [facility], [address] [place], [place] [address]          Radiology Studies: No results found.      Scheduled Meds: . Asenapine Maleate  10 mg Sublingual QHS  . Asenapine Maleate  5 mg Sublingual q morning - 10a  .  bacitracin   Topical TID  . Chlorhexidine Gluconate Cloth  6 each Topical Q0600  . docusate sodium  100 mg Oral BID  . gabapentin  300 mg Oral TID  . mupirocin ointment  1 application Nasal BID  . nicotine  14 mg Transdermal Daily  . Valbenazine Tosylate  80 mg Oral Daily   Continuous Infusions: . lactated ringers 10 mL/hr at 09/05/18 2232  . vancomycin 1,500 mg (09/06/18 0425)     LOS: 4 days    Time spent: 28 minutes.     Kathlen ModyVijaya Timarie Labell, MD [facility] Pager [phone]   If 7PM-7AM, please contact night-coverage [email or web address] Password [email or web address] 09/06/2018, 2:56 PM

## 2018-09-07 DIAGNOSIS — B9562 Methicillin resistant Staphylococcus aureus infection as the cause of diseases classified elsewhere: Secondary | ICD-10-CM

## 2018-09-07 DIAGNOSIS — F17210 Nicotine dependence, cigarettes, uncomplicated: Secondary | ICD-10-CM

## 2018-09-07 DIAGNOSIS — L02511 Cutaneous abscess of right hand: Principal | ICD-10-CM

## 2018-09-07 DIAGNOSIS — F250 Schizoaffective disorder, bipolar type: Secondary | ICD-10-CM

## 2018-09-07 LAB — CULTURE, BLOOD (ROUTINE X 2)
CULTURE: NO GROWTH
CULTURE: NO GROWTH
Special Requests: ADEQUATE
Special Requests: ADEQUATE

## 2018-09-07 MED ORDER — DOXYCYCLINE HYCLATE 50 MG PO CAPS: 100.0000 mg | ORAL_CAPSULE | Freq: Two times a day (BID) | ORAL | 0 refills | Status: AC

## 2018-09-07 MED ORDER — BACITRACIN ZINC 500 UNIT/GM EX OINT: TOPICAL_OINTMENT | Freq: Three times a day (TID) | CUTANEOUS | 0 refills | Status: AC

## 2018-09-07 MED ORDER — MUPIROCIN 2 % EX OINT: 1.0000 "application " | TOPICAL_OINTMENT | Freq: Two times a day (BID) | CUTANEOUS | 0 refills | Status: AC

## 2018-09-07 MED ORDER — OXYCODONE-ACETAMINOPHEN 5-325 MG PO TABS: 1.0000 | ORAL_TABLET | Freq: Three times a day (TID) | ORAL | 0 refills | Status: AC | PRN

## 2018-09-07 NOTE — Plan of Care (Signed)

## 2018-09-07 NOTE — Progress Notes (Signed)
Pharmacy Antibiotic Note  Troy [name] is a 37 y.o. male admitted on 09/02/2018 with cellulitis. Wbc 7.2, afebrile, renal function has been stable. I&D on 11/21 with 24hrs of gent irrigation.  11/21 Vancomycin trough = 12 within therapeutic range, drawn at appropriate time.  Plan: -Continue Vancomycin 1500 mg IV Q 12 hours -Monitor CBC, renal fx, cultures and clinical progress -Follow up LOT   Height: 5\' 10"  (177.8 cm) Weight: 165 lb (74.8 kg) IBW/kg (Calculated) : 73  Temp (24hrs), Avg:98.4 F (36.9 C), Min:98.4 F (36.9 C), Max:98.5 F (36.9 C)  Recent Labs  Lab 09/02/18 0336 09/02/18 0352 09/02/18 0412 09/03/18 0302 09/04/18 0226 09/04/18 1516  WBC 14.9*  --   --  8.9 7.2  --   CREATININE 0.98  --   --  0.80  --   --   LATICACIDVEN  --  1.29 0.9  --   --   --   VANCOTROUGH  --   --   --   --   --  12*    Estimated Creatinine Clearance: 133.1 mL/min (by C-G formula based on SCr of 0.8 mg/dL).    No Known Allergies  Antimicrobials this admission: 11/19 Vanc>> 11/9 Zosyn >> 11/23    Dose adjustments this admission: 11/21: Vancomycin trough = 12 on 1500 mg Q 12 hrs, no change.  11/19 Wound: MRSA 11/19 BCx: ngtd  11/21 MRSA PCR pos     Thank you for allowing pharmacy to be a part of this patient's care.  Ewing Schleinolton Schneur Crowson, PharmD PGY1 Pharmacy Resident 09/07/2018    11:26 AM Please check AMION for all [facility] numbers

## 2018-09-07 NOTE — Evaluation (Signed)
Occupational Therapy Evaluation Patient Details Name: Troy ChuLuke C [name] MRN: [id number] DOB: 08/05/1981 Today's Date: 09/07/2018    History of Present Illness Troy [name] is a 37 y.o. male with medical history significant of schizophrenia, anxiety and head injury presenting with hand pain.  He has had severe right hand pain with cellulites. pateint had would debridementExcisional debridement of right thumb skin/subcutaneous tissues   Clinical Impression   PATIENT WAS SEEN FOR PATIENT EDUCATION FOR ELEVATION OF R UE AND PATIENT WAS ABLE TO DEMO AND TO VERBALIZE. PATIENT WAS ALSO SEEN FOR EXERCISE OF R HAND. PATIENT WAS EDUCATED ON FLEX/EXT OF DIGITS OF HAND AND OPPOSITION OF THUMB. PATIENT HAS DECREASED ROM OF R HAND AND INCREASED EDEMA. PATIENT WAS ABLE TO RETURN DEMO EXERCISES. EDUCATED PATIENT ON IMPORTANCE OF ELEVATION AND EXERCISE FOR R HAND AND HE VERBALIZED UNDERSTANDING. OFFERED FOR OT TO COME IN ON Monday TO GO OVER EXERCISES AGAIN BUT PATIENT DEFERRED FURTHER ACUTE OT. DISCUSSED WITH PATIENT THAT HE WAS SUPPOSED TO GO TO OUTPATIENT FOR HAND REHAB AND HE IS IN AGREEMENT.     Follow Up Recommendations  Follow surgeon's recommendation for DC plan and follow-up therapies    Equipment Recommendations  None recommended by OT    Recommendations for Other Services       Precautions / Restrictions Restrictions Weight Bearing Restrictions: No      Mobility Bed Mobility Overal bed mobility: Independent                Transfers Overall transfer level: Independent                    Balance                                           ADL either performed or assessed with clinical judgement   ADL Overall ADL's : At baseline                                             Vision Baseline Vision/History: No visual deficits Patient Visual Report: No change from baseline       Perception     Praxis      Pertinent Vitals/Pain Pain  Assessment: 0-10 Pain Score: 6  Pain Location: r hand Pain Intervention(s): Limited activity within patient's tolerance;Monitored during session;Premedicated before session     Hand Dominance Right   Extremity/Trunk Assessment Upper Extremity Assessment Upper Extremity Assessment: RUE deficits/detail RUE Deficits / Details: PATIENT HAS DECREASED ROM OF DIGITS OF HAND SECONDARY TO EDEMA.  RUE Sensation: decreased light touch RUE Coordination: decreased fine motor           Communication Communication Communication: No difficulties   Cognition Arousal/Alertness: Awake/alert Behavior During Therapy: WFL for tasks assessed/performed Overall Cognitive Status: No family/caregiver present to determine baseline cognitive functioning                                     General Comments       Exercises Exercises: Hand exercises Hand Exercises Digit Composite Flexion: AROM;Right Composite Extension: AROM;Right Thumb Abduction: AROM;Right Thumb Adduction: AROM;Right Opposition: AROM;Right   Shoulder Instructions      Home Living Family/patient  expects to be discharged to:: Private residence Living Arrangements: Parent Available Help at Discharge: Family Type of Home: House Home Access: Stairs to enter Secretary/administrator of Steps: (3)   Home Layout: Two level;Bed/bath upstairs Alternate Level Stairs-Number of Steps: flight   Bathroom Shower/Tub: Chief Strategy Officer: Standard     Home Equipment: None   Additional Comments: patient will be temporarily ge staying at partents home.      Prior Functioning/Environment Level of Independence: Independent                 OT Problem List: Decreased range of motion;Impaired sensation;Impaired UE functional use;Pain      OT Treatment/Interventions:      OT Goals(Current goals can be found in the care plan section) Acute Rehab OT Goals Patient Stated Goal: go home to parents home   OT Frequency:     Barriers to D/C:            Co-evaluation              AM-PAC OT "6 Clicks" Daily Activity     Outcome Measure Help from another person eating meals?: None Help from another person taking care of personal grooming?: None Help from another person toileting, which includes using toliet, bedpan, or urinal?: None Help from another person bathing (including washing, rinsing, drying)?: None Help from another person to put on and taking off regular upper body clothing?: None Help from another person to put on and taking off regular lower body clothing?: None 6 Click Score: 24   End of Session Nurse Communication: (ok therapy)  Activity Tolerance: Patient tolerated treatment well Patient left: in bed;with call bell/phone within reach  OT Visit Diagnosis: Other (comment)(patient has decreased function in r hand)                Time: 0950-1020 OT Time Calculation (min): 30 min Charges:  OT General Charges $OT Visit: 1 Visit OT Evaluation $OT Eval Low Complexity: 1 Low OT Treatments $Therapeutic Exercise: 8-22 mins  6 CLICKS  Felice Hope 09/07/2018, 10:36 AM

## 2018-09-07 NOTE — Discharge Summary (Signed)
Physician Discharge Summary  Troy [name] WUJ:[id number] DOB: 11-29-1980 DOA: 09/02/2018  PCP: Patient, No Pcp Per  Admit date: 09/02/2018 Discharge date: 09/08/2018  Admitted From: Home.  Disposition: Home.   Recommendations for Outpatient Follow-up:  1. Follow up with PCP in 1-2 weeks 2. Please obtain BMP/CBC in one week 3. Please follow up with orthopedics as recommended.  4. Please follow up with Infectious disease in 2 weeks.  5. Wound care TID.    Discharge Condition:stable. CODE STATUS: full code  Diet recommendation: Heart Healthy   Brief/Interim Summary: Troy [name] a 37 y.o.malewith medical history significant ofschizophrenia presenting with hand pain.  Was found to have cellulitis and abscess of the right hand.  Dr. Janee Morn with hand surgery consulted and he also underwent bedside I&D in ER and cultures growing abundant staph species. He was started on antibiotics with IV vancomycin and IV Zosyn. He underwent I&D on 11/21 and his cultures grew MRSA.   Discharge Diagnoses:  Principal Problem:   Cellulitis of hand Active Problems:   Schizoaffective disorder, bipolar type (HCC)   Polysubstance abuse (HCC)   Tobacco dependence  Right hand cellulitis Failed outpatient treatment s/p I&D in ED: cultures are growing abundant staph. Final cultures grow MRSA. Hand surgery consulted  Underwent irrigation and debridement on 11/21 and MRI of the hand does not show any osteomyelitis.  He completed 5 days of IV vancomycin and plan to d/c pt in am with oral doxycycline for 2 more weeks.  Continue with pain control with oxycodone .  Currently afebrile and WBC count has normalized. Occupational Therapy evaluation ordered by orthopedics   Schizoaffective disorder Will resume saphris  Polysubstance abuse counseling and encouraged to stop illicit drug use.   Tobacco abuse:  On nicotine patch.  Counseled about cessation.    Discharge Instructions   Allergies as  of 09/07/2018   No Known Allergies     Medication List    STOP taking these medications   amoxicillin-clavulanate 875-125 MG tablet Commonly known as:  AUGMENTIN   traMADol 50 MG tablet Commonly known as:  ULTRAM     TAKE these medications   asenapine 5 MG Subl 24 hr tablet Commonly known as:  SAPHRIS Take 1 tablet (5 mg) under the tongue in the morning & 2 tablets (10 mg) under the tongue at bedtime: For mood control What changed:    how much to take  how to take this  when to take this  additional instructions   bacitracin ointment Apply topically 3 (three) times daily.   doxycycline 50 MG capsule Commonly known as:  VIBRAMYCIN Take 2 capsules (100 mg total) by mouth 2 (two) times daily for 13 days. Start taking on:  09/08/2018   gabapentin 300 MG capsule Commonly known as:  NEURONTIN Take 300 mg by mouth 3 (three) times daily.   hydrOXYzine 25 MG tablet Commonly known as:  ATARAX/VISTARIL Take 25 mg by mouth 3 (three) times daily as needed for anxiety.   INGREZZA 80 MG Caps Generic drug:  Valbenazine Tosylate Take 80 mg by mouth daily.   mupirocin ointment 2 % Commonly known as:  BACTROBAN Place 1 application into the nose 2 (two) times daily.   naproxen sodium 220 MG tablet Commonly known as:  ALEVE Take 440 mg by mouth daily as needed (pain or discomfort).   oxyCODONE-acetaminophen 5-325 MG tablet Commonly known as:  PERCOCET/ROXICET Take 1-2 tablets by mouth every 8 (eight) hours as needed for up to 3 days  for moderate pain or severe pain.       No Known Allergies  Consultations:  Orthopedics Dr Janee Morn.   Infectious disease Dr Ninetta Lights.    Procedures/Studies: Mr Hand Right W Wo Contrast  Result Date: 09/04/2018 CLINICAL DATA:  Right hand cellulitis and abscess. Recent incision and drainage over the volar aspect of the thumb IP joint. EXAM: MRI OF THE RIGHT HAND WITHOUT AND WITH CONTRAST TECHNIQUE: Multiplanar, multisequence MR imaging  of the right hand was performed before and after the administration of intravenous contrast. CONTRAST:  7.5 mL Gadavist intravenous contrast. COMPARISON:  Right hand x-rays dated August 30, 2018. FINDINGS: Bones/Joint/Cartilage No marrow signal abnormality. No fracture or dislocation. Normal alignment. Moderate thumb IP joint effusion. Ligaments Collateral ligaments are intact. Muscles and Tendons There is abnormal fluid and enhancement surrounding the flexor pollicis longus tendon in the thenar region. Abnormal tendon sheath enhancement extends through the carpal tunnel into the distal forearm. Flexor and extensor compartment tendons are intact. Edema and enhancement of the abductor and flexor pollicis brevis muscles, opponens pollicis muscle, and to lesser extent the adductor pollicis muscle. Soft tissue There is a open skin defect along the volar thumb at the level of the IP joint which communicates with a small amount of rim enhancing fluid overlying the flexor pollicis longus tendon and extending proximally to the base of the first proximal phalanx. This measures approximately 0.8 x 1.6 x 2.3 cm. There is diffuse soft tissue swelling of the thumb, thenar region, and dorsal hand. IMPRESSION: 1. Open skin defect along the volar thumb at the level of the IP joint which communicates with a 0.8 x 1.6 x 2.3 cm abscess overlying the flexor pollicis longus tendon, extending from the IP joint to the base of the first proximal phalanx. 2. Infectious tenosynovitis of the flexor pollicis longus tendon, with abnormal tendon sheath enhancement extending proximally through the carpal tunnel into the distal forearm. 3. Moderate thumb IP joint effusion. Septic arthritis is not excluded. 4. Thenar myositis. Prominent cellulitis of the thumb, thenar region, and dorsal hand. 5. No evidence of osteomyelitis. Electronically Signed   By: Obie Dredge M.D.   On: 09/04/2018 11:42   Dg Hand Complete Right  Result Date:  08/30/2018 CLINICAL DATA:  Hand injury while chopping wood. Injury to the right thumb. EXAM: RIGHT HAND - COMPLETE 3+ VIEW COMPARISON:  10/19/2013 FINDINGS: Negative for fracture or dislocation in the right hand. Bracelet or garment overlying the right wrist. Mild angulation at the base of the fourth metacarpal bone is chronic. No evidence for a radiopaque foreign body. There may be soft tissue swelling involving the right thumb. Stable appearance of the carpal bones. IMPRESSION: No acute bone abnormality to the right hand. Electronically Signed   By: Richarda Overlie M.D.   On: 08/30/2018 11:02       Subjective: No chest pain or sob.   Discharge Exam: Vitals:   09/07/18 0416 09/07/18 1422  BP: 122/82 (!) 139/91  Pulse: 70 61  Resp:  18  Temp: 98.4 F (36.9 C) 97.8 F (36.6 C)  SpO2: 98% 100%   Vitals:   09/06/18 1404 09/06/18 2022 09/07/18 0416 09/07/18 1422  BP: 125/77 127/88 122/82 (!) 139/91  Pulse: 67 68 70 61  Resp: 20   18  Temp: 98.5 F (36.9 C) 98.4 F (36.9 C) 98.4 F (36.9 C) 97.8 F (36.6 C)  TempSrc: Oral Oral Oral Oral  SpO2: 99% 98% 98% 100%  Weight:  Height:        General: Pt is alert, awake, not in acute distress Cardiovascular: RRR, S1/S2 +, no rubs, no gallops Respiratory: CTA bilaterally, no wheezing, no rhonchi Abdominal: Soft, NT, ND, bowel sounds + Extremities: no edema, no cyanosis    The results of significant diagnostics from this hospitalization (including imaging, microbiology, ancillary and laboratory) are listed below for reference.     Microbiology: Recent Results (from the past 240 hour(s))  Culture, blood (routine x 2)     Status: None   Collection Time: 09/02/18  3:25 AM  Result Value Ref Range Status   Specimen Description SITE NOT SPECIFIED  Final   Special Requests   Final    BOTTLES DRAWN AEROBIC AND ANAEROBIC Blood Culture adequate volume   Culture   Final    NO GROWTH 5 DAYS Performed at [facility], [address] [place], [place] [address]    Report Status 09/07/2018 FINAL  Final  Culture, blood (routine x 2)     Status: None   Collection Time: 09/02/18  4:13 AM  Result Value Ref Range Status   Specimen Description BLOOD RIGHT ARM  Final   Special Requests   Final    BOTTLES DRAWN AEROBIC AND ANAEROBIC Blood Culture adequate volume   Culture   Final    NO GROWTH 5 DAYS Performed at [facility], [address] [place], [place] [address]    Report Status 09/07/2018 FINAL  Final  Aerobic Culture (superficial specimen)     Status: None   Collection Time: 09/02/18  6:10 AM  Result Value Ref Range Status   Specimen Description WOUND  Final   Special Requests NONE  Final   Gram Stain   Final    RARE WBC PRESENT, PREDOMINANTLY PMN MODERATE GRAM POSITIVE COCCI IN CLUSTERS Performed at [facility], [address] [place], [place] [address]    Culture   Final    MODERATE METHICILLIN RESISTANT STAPHYLOCOCCUS AUREUS   Report Status 09/06/2018 FINAL  Final   Organism ID, Bacteria METHICILLIN RESISTANT STAPHYLOCOCCUS AUREUS  Final      Susceptibility   Methicillin resistant staphylococcus aureus - MIC*    CIPROFLOXACIN Value in next row Sensitive      SENSITIVE<=0.5    ERYTHROMYCIN Value in next row Resistant      RESISTANT>=8    GENTAMICIN Value in next row Sensitive      SENSITIVE<=0.5    OXACILLIN Value in next row Resistant      RESISTANT>=4    TETRACYCLINE Value in next row Sensitive      SENSITIVE<=1    VANCOMYCIN Value in next row Sensitive      SENSITIVE<=0.5    TRIMETH/SULFA Value in next row Sensitive      SENSITIVE<=10    CLINDAMYCIN Value in next row Sensitive      SENSITIVE<=0.25    RIFAMPIN Value in next row Sensitive      SENSITIVE<=0.5    Inducible Clindamycin Value in next row Sensitive      SENSITIVE<=0.5    * MODERATE METHICILLIN RESISTANT STAPHYLOCOCCUS AUREUS  Surgical pcr screen     Status: Abnormal   Collection Time: 09/04/18  2:05 AM  Result  Value Ref Range Status   MRSA, PCR POSITIVE (A) NEGATIVE Final    Comment: RESULT CALLED TO, READ BACK BY AND VERIFIED WITHCorky Downs RN [id number] 09/04/18 A BROWNING    Staphylococcus aureus POSITIVE (A) NEGATIVE Final  Comment: (NOTE) The Xpert SA Assay (FDA approved for NASAL specimens in patients 35 years of age and older), is one component of a comprehensive surveillance program. It is not intended to diagnose infection nor to guide or monitor treatment. Performed at [facility], [address] [place], [place] [address]      Labs: BNP (last 3 results) No results for input(s): BNP in the last 8760 hours. Basic Metabolic Panel: Recent Labs  Lab 09/02/18 0336 09/03/18 0302  NA 131* 135  K 3.6 3.9  CL 98 103  CO2 21* 24  GLUCOSE 129* 109*  BUN 7 5*  CREATININE 0.98 0.80  CALCIUM 9.5 8.8*   Liver Function Tests: No results for input(s): AST, ALT, ALKPHOS, BILITOT, PROT, ALBUMIN in the last 168 hours. No results for input(s): LIPASE, AMYLASE in the last 168 hours. No results for input(s): AMMONIA in the last 168 hours. CBC: Recent Labs  Lab 09/02/18 0336 09/03/18 0302 09/04/18 0226  WBC 14.9* 8.9 7.2  NEUTROABS 11.8*  --  3.6  HGB 14.7 13.6 13.0  HCT 43.9 40.9 39.2  MCV 90.3 91.9 90.7  PLT 377 256 337   Cardiac Enzymes: No results for input(s): CKTOTAL, CKMB, CKMBINDEX, TROPONINI in the last 168 hours. BNP: Invalid input(s): POCBNP CBG: No results for input(s): GLUCAP in the last 168 hours. D-Dimer No results for input(s): DDIMER in the last 72 hours. Hgb A1c No results for input(s): HGBA1C in the last 72 hours. Lipid Profile No results for input(s): CHOL, HDL, LDLCALC, TRIG, CHOLHDL, LDLDIRECT in the last 72 hours. Thyroid function studies No results for input(s): TSH, T4TOTAL, T3FREE, THYROIDAB in the last 72 hours.  Invalid input(s): FREET3 Anemia work up No results for input(s): VITAMINB12, FOLATE, FERRITIN, TIBC, IRON, RETICCTPCT in the  last 72 hours. Urinalysis    Component Value Date/Time   COLORURINE YELLOW 03/05/2012 1822   APPEARANCEUR CLEAR 03/05/2012 1822   LABSPEC 1.010 04/26/2018 1627   PHURINE 6.0 04/26/2018 1627   GLUCOSEU NEGATIVE 04/26/2018 1627   HGBUR NEGATIVE 04/26/2018 1627   BILIRUBINUR NEGATIVE 04/26/2018 1627   KETONESUR TRACE (A) 04/26/2018 1627   PROTEINUR NEGATIVE 04/26/2018 1627   UROBILINOGEN 0.2 04/26/2018 1627   NITRITE NEGATIVE 04/26/2018 1627   LEUKOCYTESUR SMALL (A) 04/26/2018 1627   Sepsis Labs Invalid input(s): PROCALCITONIN,  WBC,  LACTICIDVEN Microbiology Recent Results (from the past 240 hour(s))  Culture, blood (routine x 2)     Status: None   Collection Time: 09/02/18  3:25 AM  Result Value Ref Range Status   Specimen Description SITE NOT SPECIFIED  Final   Special Requests   Final    BOTTLES DRAWN AEROBIC AND ANAEROBIC Blood Culture adequate volume   Culture   Final    NO GROWTH 5 DAYS Performed at [facility], [address] [place], [place] [address]    Report Status 09/07/2018 FINAL  Final  Culture, blood (routine x 2)     Status: None   Collection Time: 09/02/18  4:13 AM  Result Value Ref Range Status   Specimen Description BLOOD RIGHT ARM  Final   Special Requests   Final    BOTTLES DRAWN AEROBIC AND ANAEROBIC Blood Culture adequate volume   Culture   Final    NO GROWTH 5 DAYS Performed at [facility], [address] [place], [place] [address]    Report Status 09/07/2018 FINAL  Final  Aerobic Culture (superficial specimen)     Status: None  Collection Time: 09/02/18  6:10 AM  Result Value Ref Range Status   Specimen Description WOUND  Final   Special Requests NONE  Final   Gram Stain   Final    RARE WBC PRESENT, PREDOMINANTLY PMN MODERATE GRAM POSITIVE COCCI IN CLUSTERS Performed at [facility], [address], [place], [place] [address]    Culture   Final    MODERATE METHICILLIN RESISTANT STAPHYLOCOCCUS AUREUS   Report Status  09/06/2018 FINAL  Final   Organism ID, Bacteria METHICILLIN RESISTANT STAPHYLOCOCCUS AUREUS  Final      Susceptibility   Methicillin resistant staphylococcus aureus - MIC*    CIPROFLOXACIN Value in next row Sensitive      SENSITIVE<=0.5    ERYTHROMYCIN Value in next row Resistant      RESISTANT>=8    GENTAMICIN Value in next row Sensitive      SENSITIVE<=0.5    OXACILLIN Value in next row Resistant      RESISTANT>=4    TETRACYCLINE Value in next row Sensitive      SENSITIVE<=1    VANCOMYCIN Value in next row Sensitive      SENSITIVE<=0.5    TRIMETH/SULFA Value in next row Sensitive      SENSITIVE<=10    CLINDAMYCIN Value in next row Sensitive      SENSITIVE<=0.25    RIFAMPIN Value in next row Sensitive      SENSITIVE<=0.5    Inducible Clindamycin Value in next row Sensitive      SENSITIVE<=0.5    * MODERATE METHICILLIN RESISTANT STAPHYLOCOCCUS AUREUS  Surgical pcr screen     Status: Abnormal   Collection Time: 09/04/18  2:05 AM  Result Value Ref Range Status   MRSA, PCR POSITIVE (A) NEGATIVE Final    Comment: RESULT CALLED TO, READ BACK BY AND VERIFIED WITHCorky Downs RN [phone] 09/04/18 A BROWNING    Staphylococcus aureus POSITIVE (A) NEGATIVE Final    Comment: (NOTE) The Xpert SA Assay (FDA approved for NASAL specimens in patients 84 years of age and older), is one component of a comprehensive surveillance program. It is not intended to diagnose infection nor to guide or monitor treatment. Performed at [facility], [address] [place], [place] [address]      Time coordinating discharge: 31 minutes  SIGNED:   Kathlen Mody, MD  [facility] 09/07/2018, 5:51 PM Pager   If 7PM-7AM, please contact night-coverage [email or web address] Password [email or web address]

## 2018-09-07 NOTE — Consult Note (Signed)
[facility]    Date of Admission:  09/02/2018   Total days of antibiotics: 2 vanco               Reason for Consult: R hand abscess    Referring Provider: Blake Divine   Assessment: R hand abscess MRSA Schizophrenia  Plan: 1. Continue vanco while in hospital. Would try to give him at least 5 days of vanco 2. Can then be d/c after dose of oritavancin 3. Home with po doxy as well, for 2 weeks 4. Check HIV and Hep C 5. Tdap given 05-2014, up to date  Available as needed. Be glad to see him in ID clinic for f/u in 2 weeks.   Thank you so much for this interesting consult,  Principal Problem:   Cellulitis of hand Active Problems:   Schizoaffective disorder, bipolar type (HCC)   Polysubstance abuse (HCC)   Tobacco dependence   . Asenapine Maleate  10 mg Sublingual QHS  . Asenapine Maleate  5 mg Sublingual q morning - 10a  . bacitracin   Topical TID  . Chlorhexidine Gluconate Cloth  6 each Topical Q0600  . docusate sodium  100 mg Oral BID  . gabapentin  300 mg Oral TID  . mupirocin ointment  1 application Nasal BID  . nicotine  14 mg Transdermal Daily  . Valbenazine Tosylate  80 mg Oral Daily    HPI: Troy [name] is a 37 y.o. male with hx of chopping wood, schizophrenia, seen in ED on 11-16 with R hand cellulitis. He was given augmentin. He returned on 11-19 with heat and streaking up his arm, and was found to have subq abscess at the level of the IP joint at his L thumb.  He was was taken to OR by hand surgery and had debridement on 11-21. He was started on vanco/zosyn and his Cx grew MRSA. His zosyn was stopped today.     Review of Systems: Review of Systems  Constitutional: Negative for chills and fever.  Respiratory: Negative for shortness of breath.   Gastrointestinal: Negative for constipation and diarrhea.  Genitourinary: Negative for dysuria.  Musculoskeletal: Positive for joint pain.  Please see HPI. All other systems  reviewed and negative.   Past Medical History:  Diagnosis Date  . Anxiety   . Cellulitis and abscess of hand 08/2018   RIGHT HAND   . Head injury   . Schizophrenia (HCC)     Social History   Tobacco Use  . Smoking status: Current Every Day Smoker    Packs/day: 1.00    Years: 20.00    Pack years: 20.00    Types: Cigarettes  . Smokeless tobacco: Never Used  Substance Use Topics  . Alcohol use: Yes    Comment: occasionally; once q 1-2 wks  . Drug use: Yes    Types: Marijuana, Methamphetamines    Comment: uses meth regularly, also marijuana    Family History  Problem Relation Age of Onset  . Healthy Mother   . Healthy Father      Medications:  Scheduled: . Asenapine Maleate  10 mg Sublingual QHS  . Asenapine Maleate  5 mg Sublingual q morning - 10a  . bacitracin   Topical TID  . Chlorhexidine Gluconate Cloth  6 each Topical Q0600  . docusate sodium  100 mg Oral BID  . gabapentin  300 mg Oral TID  . mupirocin ointment  1 application Nasal BID  .  nicotine  14 mg Transdermal Daily  . Valbenazine Tosylate  80 mg Oral Daily    Abtx:  Anti-infectives (From admission, onward)   Start     Dose/Rate Route Frequency Ordered Stop   09/04/18 2130  Gentamicin 0.5mg /ml in Normal Saline for irrigation at 325ml/hr  Status:  Discontinued     5 mL/hr  Irrigation Continuous 09/04/18 2034 09/04/18 2102   09/04/18 2130  Gentamicin 0.5mg /ml in Normal Saline for irrigation at 665ml/hr  Status:  Discontinued     5 mL/hr  Irrigation Continuous 09/04/18 2102 09/04/18 2107   09/04/18 2130  Gentamicin 50mg /15600ml in sodium chloride 0.9% for wound IRRIGATION at 365ml/hr     5 mL/hr  Irrigation Continuous 09/04/18 2107 09/05/18 1729   09/03/18 1000  piperacillin-tazobactam (ZOSYN) IVPB 3.375 g  Status:  Discontinued     3.375 g 12.5 mL/hr over 240 Minutes Intravenous Tomorrow-1000 09/02/18 0903 09/02/18 0904   09/02/18 1600  vancomycin (VANCOCIN) 1,500 mg in sodium chloride 0.9 % 500 mL IVPB      1,500 mg 250 mL/hr over 120 Minutes Intravenous Every 12 hours 09/02/18 0918     09/02/18 1000  piperacillin-tazobactam (ZOSYN) IVPB 3.375 g  Status:  Discontinued     3.375 g 12.5 mL/hr over 240 Minutes Intravenous Every 8 hours 09/02/18 0904 09/06/18 1455   09/02/18 0945  piperacillin-tazobactam (ZOSYN) IVPB 3.375 g  Status:  Discontinued     3.375 g 100 mL/hr over 30 Minutes Intravenous Every 6 hours 09/02/18 0851 09/02/18 0903   09/02/18 0345  vancomycin (VANCOCIN) IVPB 1000 mg/200 mL premix     1,000 mg 200 mL/hr over 60 Minutes Intravenous  Once 09/02/18 0341 09/02/18 0525   09/02/18 0345  piperacillin-tazobactam (ZOSYN) IVPB 3.375 g     3.375 g 100 mL/hr over 30 Minutes Intravenous  Once 09/02/18 0341 09/02/18 0422        OBJECTIVE: Blood pressure (!) 139/91, pulse 61, temperature 97.8 F (36.6 C), temperature source Oral, resp. rate 18, height 5\' 10"  (1.778 m), weight 74.8 kg, SpO2 100 %.  Physical Exam  Constitutional: He appears well-developed and well-nourished.  HENT:  Mouth/Throat: No oropharyngeal exudate.  Eyes: Pupils are equal, round, and reactive to light. EOM are normal.  Neck: Normal range of motion. Neck supple.  Cardiovascular: Normal rate, regular rhythm and normal heart sounds.  Pulmonary/Chest: Effort normal and breath sounds normal.  Abdominal: Soft. Bowel sounds are normal. He exhibits no distension. There is no tenderness.  Musculoskeletal:       Arms: Lymphadenopathy:    He has no cervical adenopathy.    Lab Results No results found for this or any previous visit (from the past 48 hour(s)).    Component Value Date/Time   SDES WOUND 09/02/2018 0610   SPECREQUEST NONE 09/02/2018 0610   CULT  09/02/2018 0610    MODERATE METHICILLIN RESISTANT STAPHYLOCOCCUS AUREUS   REPTSTATUS 09/06/2018 FINAL 09/02/2018 0610   No results found. Recent Results (from the past 240 hour(s))  Culture, blood (routine x 2)     Status: None   Collection Time:  09/02/18  3:25 AM  Result Value Ref Range Status   Specimen Description SITE NOT SPECIFIED  Final   Special Requests   Final    BOTTLES DRAWN AEROBIC AND ANAEROBIC Blood Culture adequate volume   Culture   Final    NO GROWTH 5 DAYS Performed at [facility], [address] [place], [place] [address]    Report Status  09/07/2018 FINAL  Final  Culture, blood (routine x 2)     Status: None   Collection Time: 09/02/18  4:13 AM  Result Value Ref Range Status   Specimen Description BLOOD RIGHT ARM  Final   Special Requests   Final    BOTTLES DRAWN AEROBIC AND ANAEROBIC Blood Culture adequate volume   Culture   Final    NO GROWTH 5 DAYS Performed at [facility], [address], [place], [place] [address]    Report Status 09/07/2018 FINAL  Final  Aerobic Culture (superficial specimen)     Status: None   Collection Time: 09/02/18  6:10 AM  Result Value Ref Range Status   Specimen Description WOUND  Final   Special Requests NONE  Final   Gram Stain   Final    RARE WBC PRESENT, PREDOMINANTLY PMN MODERATE GRAM POSITIVE COCCI IN CLUSTERS Performed at [facility], [address] [place], [place] [address]    Culture   Final    MODERATE METHICILLIN RESISTANT STAPHYLOCOCCUS AUREUS   Report Status 09/06/2018 FINAL  Final   Organism ID, Bacteria METHICILLIN RESISTANT STAPHYLOCOCCUS AUREUS  Final      Susceptibility   Methicillin resistant staphylococcus aureus - MIC*    CIPROFLOXACIN Value in next row Sensitive      SENSITIVE<=0.5    ERYTHROMYCIN Value in next row Resistant      RESISTANT>=8    GENTAMICIN Value in next row Sensitive      SENSITIVE<=0.5    OXACILLIN Value in next row Resistant      RESISTANT>=4    TETRACYCLINE Value in next row Sensitive      SENSITIVE<=1    VANCOMYCIN Value in next row Sensitive      SENSITIVE<=0.5    TRIMETH/SULFA Value in next row Sensitive      SENSITIVE<=10    CLINDAMYCIN Value in next row Sensitive      SENSITIVE<=0.25     RIFAMPIN Value in next row Sensitive      SENSITIVE<=0.5    Inducible Clindamycin Value in next row Sensitive      SENSITIVE<=0.5    * MODERATE METHICILLIN RESISTANT STAPHYLOCOCCUS AUREUS  Surgical pcr screen     Status: Abnormal   Collection Time: 09/04/18  2:05 AM  Result Value Ref Range Status   MRSA, PCR POSITIVE (A) NEGATIVE Final    Comment: RESULT CALLED TO, READ BACK BY AND VERIFIED WITHCorky Downs RN [phone] 09/04/18 A BROWNING    Staphylococcus aureus POSITIVE (A) NEGATIVE Final    Comment: (NOTE) The Xpert SA Assay (FDA approved for NASAL specimens in patients 20 years of age and older), is one component of a comprehensive surveillance program. It is not intended to diagnose infection nor to guide or monitor treatment. Performed at [facility], [address] [place], [place] [address]     Microbiology: Recent Results (from the past 240 hour(s))  Culture, blood (routine x 2)     Status: None   Collection Time: 09/02/18  3:25 AM  Result Value Ref Range Status   Specimen Description SITE NOT SPECIFIED  Final   Special Requests   Final    BOTTLES DRAWN AEROBIC AND ANAEROBIC Blood Culture adequate volume   Culture   Final    NO GROWTH 5 DAYS Performed at [facility], [address], [place], [place] [address]    Report Status 09/07/2018 FINAL  Final  Culture, blood (routine x 2)  Status: None   Collection Time: 09/02/18  4:13 AM  Result Value Ref Range Status   Specimen Description BLOOD RIGHT ARM  Final   Special Requests   Final    BOTTLES DRAWN AEROBIC AND ANAEROBIC Blood Culture adequate volume   Culture   Final    NO GROWTH 5 DAYS Performed at [facility], [address], [place], [place] [address]    Report Status 09/07/2018 FINAL  Final  Aerobic Culture (superficial specimen)     Status: None   Collection Time: 09/02/18  6:10 AM  Result Value Ref Range Status   Specimen Description WOUND  Final   Special Requests NONE  Final    Gram Stain   Final    RARE WBC PRESENT, PREDOMINANTLY PMN MODERATE GRAM POSITIVE COCCI IN CLUSTERS Performed at [facility], [address] [place], [place] [address]    Culture   Final    MODERATE METHICILLIN RESISTANT STAPHYLOCOCCUS AUREUS   Report Status 09/06/2018 FINAL  Final   Organism ID, Bacteria METHICILLIN RESISTANT STAPHYLOCOCCUS AUREUS  Final      Susceptibility   Methicillin resistant staphylococcus aureus - MIC*    CIPROFLOXACIN Value in next row Sensitive      SENSITIVE<=0.5    ERYTHROMYCIN Value in next row Resistant      RESISTANT>=8    GENTAMICIN Value in next row Sensitive      SENSITIVE<=0.5    OXACILLIN Value in next row Resistant      RESISTANT>=4    TETRACYCLINE Value in next row Sensitive      SENSITIVE<=1    VANCOMYCIN Value in next row Sensitive      SENSITIVE<=0.5    TRIMETH/SULFA Value in next row Sensitive      SENSITIVE<=10    CLINDAMYCIN Value in next row Sensitive      SENSITIVE<=0.25    RIFAMPIN Value in next row Sensitive      SENSITIVE<=0.5    Inducible Clindamycin Value in next row Sensitive      SENSITIVE<=0.5    * MODERATE METHICILLIN RESISTANT STAPHYLOCOCCUS AUREUS  Surgical pcr screen     Status: Abnormal   Collection Time: 09/04/18  2:05 AM  Result Value Ref Range Status   MRSA, PCR POSITIVE (A) NEGATIVE Final    Comment: RESULT CALLED TO, READ BACK BY AND VERIFIED WITHCorky Downs RN [phone] 09/04/18 A BROWNING    Staphylococcus aureus POSITIVE (A) NEGATIVE Final    Comment: (NOTE) The Xpert SA Assay (FDA approved for NASAL specimens in patients 7 years of age and older), is one component of a comprehensive surveillance program. It is not intended to diagnose infection nor to guide or monitor treatment. Performed at [facility], [address], [place], [place] [address]     Radiographs and labs were personally reviewed by me.   Johny Sax, MD [facility] [phone] 09/07/2018, 4:26 PM

## 2018-09-08 DIAGNOSIS — F17200 Nicotine dependence, unspecified, uncomplicated: Secondary | ICD-10-CM

## 2018-09-08 LAB — HEPATITIS PANEL, ACUTE
Hep A IgM: NEGATIVE
Hep B C IgM: NEGATIVE
Hepatitis B Surface Ag: NEGATIVE

## 2018-09-08 NOTE — Discharge Summary (Signed)
Physician Discharge Summary  Troy [name] ZOX:[id number] DOB: 12/16/1980 DOA: 09/02/2018  PCP: Patient, No Pcp Per  Admit date: 09/02/2018 Discharge date: 09/08/2018  Admitted From: Home.  Disposition: Home.   Recommendations for Outpatient Follow-up:  1. Follow up with PCP in 1-2 weeks 2. Please obtain BMP/CBC in one week 3. Please follow up with orthopedics as recommended.  4. Please follow up with Infectious disease in 2 weeks.  5. Wound care TID.    Discharge Condition:stable. CODE STATUS: full code  Diet recommendation: Heart Healthy   Brief/Interim Summary: Troy [name] a 37 y.o.malewith medical history significant ofschizophrenia presenting with hand pain.  Was found to have cellulitis and abscess of the right hand.  Dr. Janee Morn with hand surgery consulted and he also underwent bedside I&D in ER and cultures growing abundant staph species. He was started on antibiotics with IV vancomycin and IV Zosyn. He underwent I&D on 11/21 and his cultures grew MRSA.   He was continued on IV vancomycin until the day of discharge (11/25).  He was evaluated by infectious disease and discharged on doxycycline for 2 more weeks.  Patient to follow-up with his primary care doctor, ID and orthopedic surgeon.  Discharge Diagnoses:  Principal Problem:   Cellulitis of hand Active Problems:   Schizoaffective disorder, bipolar type (HCC)   Polysubstance abuse (HCC)   Tobacco dependence  Right hand cellulitis Failed outpatient treatment s/p I&D in ED: he was started on antibiotics with IV vancomycin and IV Zosyn. He underwent I&D on 11/21 and his cultures grew MRSA.   He was continued on IV vancomycin until the day of discharge (11/25).  He was evaluated by infectious disease and discharged on doxycycline for 2 more weeks.  Patient to follow-up with his primary care doctor, ID and orthopedic surgeon.  Patient to continue daily dressing at home.  Schizoaffective disorder Will resume  saphris  Polysubstance abuse counseling and encouraged to stop illicit drug use.   Tobacco abuse:  On nicotine patch.  Counseled about cessation.    Discharge Instructions  Discharge Instructions    Ambulatory referral to Physical Therapy   Complete by:  As directed    Needs Hand OT   Iontophoresis - 4 mg/ml of dexamethasone:  No   T.E.N.S. Unit Evaluation and Dispense as Indicated:  No   Diet - low sodium heart healthy   Complete by:  As directed    Increase activity slowly   Complete by:  As directed      Allergies as of 09/08/2018   No Known Allergies     Medication List    STOP taking these medications   amoxicillin-clavulanate 875-125 MG tablet Commonly known as:  AUGMENTIN   traMADol 50 MG tablet Commonly known as:  ULTRAM     TAKE these medications   asenapine 5 MG Subl 24 hr tablet Commonly known as:  SAPHRIS Take 1 tablet (5 mg) under the tongue in the morning & 2 tablets (10 mg) under the tongue at bedtime: For mood control What changed:    how much to take  how to take this  when to take this  additional instructions   bacitracin ointment Apply topically 3 (three) times daily.   doxycycline 50 MG capsule Commonly known as:  VIBRAMYCIN Take 2 capsules (100 mg total) by mouth 2 (two) times daily for 13 days.   gabapentin 300 MG capsule Commonly known as:  NEURONTIN Take 300 mg by mouth 3 (three) times daily.   hydrOXYzine  25 MG tablet Commonly known as:  ATARAX/VISTARIL Take 25 mg by mouth 3 (three) times daily as needed for anxiety.   INGREZZA 80 MG Caps Generic drug:  Valbenazine Tosylate Take 80 mg by mouth daily.   mupirocin ointment 2 % Commonly known as:  BACTROBAN Place 1 application into the nose 2 (two) times daily.   naproxen sodium 220 MG tablet Commonly known as:  ALEVE Take 440 mg by mouth daily as needed (pain or discomfort).   oxyCODONE-acetaminophen 5-325 MG tablet Commonly known as:  PERCOCET/ROXICET Take 1-2  tablets by mouth every 8 (eight) hours as needed for up to 3 days for moderate pain or severe pain.      Follow-up Information    Thompson, David, MD. Schedule an appointment as soon as possible for a visit.   Specialty:  Orthopedic Surgery Why:  in approx 2 weeks to check your wound andMack Hook thumb movement Contact information: [address] [place] [id number] [phone]        Ginnie SmartHatcher, Jeffrey C, MD. Schedule an appointment as soon as possible for a visit in 2 week(s).   Specialty:  Infectious Diseases Contact information: [address] [place] [id number] [phone]        [facility]. Go to.   Why:  your appointment is scheduled for Thursday, September 18, 2018 at 8:30am, you will See Scot Juniffany Noel, NP.  Contact information: [address] [place] [phone]         No Known Allergies  Consultations:  Orthopedics Dr Janee Mornhompson.   Infectious disease Dr Ninetta LightsHatcher.    Procedures/Studies: Mr Hand Right W Wo Contrast  Result Date: 09/04/2018 CLINICAL DATA:  Right hand cellulitis and abscess. Recent incision and drainage over the volar aspect of the thumb IP joint. EXAM: MRI OF THE RIGHT HAND WITHOUT AND WITH CONTRAST TECHNIQUE: Multiplanar, multisequence MR imaging of the right hand was performed before and after the administration of intravenous contrast. CONTRAST:  7.5 mL Gadavist intravenous contrast. COMPARISON:  Right hand x-rays dated August 30, 2018. FINDINGS: Bones/Joint/Cartilage No marrow signal abnormality. No fracture or dislocation. Normal alignment. Moderate thumb IP joint effusion. Ligaments Collateral ligaments are intact. Muscles and Tendons There is abnormal fluid and enhancement surrounding the flexor pollicis longus tendon in the thenar region. Abnormal tendon sheath enhancement extends through the carpal tunnel into the distal forearm. Flexor and extensor compartment  tendons are intact. Edema and enhancement of the abductor and flexor pollicis brevis muscles, opponens pollicis muscle, and to lesser extent the adductor pollicis muscle. Soft tissue There is a open skin defect along the volar thumb at the level of the IP joint which communicates with a small amount of rim enhancing fluid overlying the flexor pollicis longus tendon and extending proximally to the base of the first proximal phalanx. This measures approximately 0.8 x 1.6 x 2.3 cm. There is diffuse soft tissue swelling of the thumb, thenar region, and dorsal hand. IMPRESSION: 1. Open skin defect along the volar thumb at the level of the IP joint which communicates with a 0.8 x 1.6 x 2.3 cm abscess overlying the flexor pollicis longus tendon, extending from the IP joint to the base of the first proximal phalanx. 2. Infectious tenosynovitis of the flexor pollicis longus tendon, with abnormal tendon sheath enhancement extending proximally through the carpal tunnel into the distal forearm. 3. Moderate thumb IP joint effusion. Septic arthritis is not excluded. 4. Thenar myositis. Prominent cellulitis of  the thumb, thenar region, and dorsal hand. 5. No evidence of osteomyelitis. Electronically Signed   By: Obie Dredge M.D.   On: 09/04/2018 11:42   Dg Hand Complete Right  Result Date: 08/30/2018 CLINICAL DATA:  Hand injury while chopping wood. Injury to the right thumb. EXAM: RIGHT HAND - COMPLETE 3+ VIEW COMPARISON:  10/19/2013 FINDINGS: Negative for fracture or dislocation in the right hand. Bracelet or garment overlying the right wrist. Mild angulation at the base of the fourth metacarpal bone is chronic. No evidence for a radiopaque foreign body. There may be soft tissue swelling involving the right thumb. Stable appearance of the carpal bones. IMPRESSION: No acute bone abnormality to the right hand. Electronically Signed   By: Richarda Overlie M.D.   On: 08/30/2018 11:02      Subjective: No chest pain or sob.    Discharge Exam: Vitals:   09/07/18 1920 09/08/18 0323  BP: 136/89 117/72  Pulse: 66 72  Resp:    Temp: 98.5 F (36.9 C) 98.5 F (36.9 C)  SpO2: 96% 97%   Vitals:   09/07/18 0416 09/07/18 1422 09/07/18 1920 09/08/18 0323  BP: 122/82 (!) 139/91 136/89 117/72  Pulse: 70 61 66 72  Resp:  18    Temp: 98.4 F (36.9 C) 97.8 F (36.6 C) 98.5 F (36.9 C) 98.5 F (36.9 C)  TempSrc: Oral Oral Oral Oral  SpO2: 98% 100% 96% 97%  Weight:      Height:        General: Pt is alert, awake, not in acute distress Cardiovascular: RRR, S1/S2 +, no rubs, no gallops Respiratory: CTA bilaterally, no wheezing, no rhonchi Abdominal: Soft, NT, ND, bowel sounds + Extremities: no edema, no cyanosis    The results of significant diagnostics from this hospitalization (including imaging, microbiology, ancillary and laboratory) are listed below for reference.     Microbiology: Recent Results (from the past 240 hour(s))  Culture, blood (routine x 2)     Status: None   Collection Time: 09/02/18  3:25 AM  Result Value Ref Range Status   Specimen Description SITE NOT SPECIFIED  Final   Special Requests   Final    BOTTLES DRAWN AEROBIC AND ANAEROBIC Blood Culture adequate volume   Culture   Final    NO GROWTH 5 DAYS Performed at [facility], [address], [place], [place] [address]    Report Status 09/07/2018 FINAL  Final  Culture, blood (routine x 2)     Status: None   Collection Time: 09/02/18  4:13 AM  Result Value Ref Range Status   Specimen Description BLOOD RIGHT ARM  Final   Special Requests   Final    BOTTLES DRAWN AEROBIC AND ANAEROBIC Blood Culture adequate volume   Culture   Final    NO GROWTH 5 DAYS Performed at [facility], [address], [place], [place] [address]    Report Status 09/07/2018 FINAL  Final  Aerobic Culture (superficial specimen)     Status: None   Collection Time: 09/02/18  6:10 AM  Result Value Ref Range Status   Specimen Description WOUND   Final   Special Requests NONE  Final   Gram Stain   Final    RARE WBC PRESENT, PREDOMINANTLY PMN MODERATE GRAM POSITIVE COCCI IN CLUSTERS Performed at [facility], [address] [place], [place] [address]    Culture   Final    MODERATE METHICILLIN RESISTANT STAPHYLOCOCCUS AUREUS   Report  Status 09/06/2018 FINAL  Final   Organism ID, Bacteria METHICILLIN RESISTANT STAPHYLOCOCCUS AUREUS  Final      Susceptibility   Methicillin resistant staphylococcus aureus - MIC*    CIPROFLOXACIN Value in next row Sensitive      SENSITIVE<=0.5    ERYTHROMYCIN Value in next row Resistant      RESISTANT>=8    GENTAMICIN Value in next row Sensitive      SENSITIVE<=0.5    OXACILLIN Value in next row Resistant      RESISTANT>=4    TETRACYCLINE Value in next row Sensitive      SENSITIVE<=1    VANCOMYCIN Value in next row Sensitive      SENSITIVE<=0.5    TRIMETH/SULFA Value in next row Sensitive      SENSITIVE<=10    CLINDAMYCIN Value in next row Sensitive      SENSITIVE<=0.25    RIFAMPIN Value in next row Sensitive      SENSITIVE<=0.5    Inducible Clindamycin Value in next row Sensitive      SENSITIVE<=0.5    * MODERATE METHICILLIN RESISTANT STAPHYLOCOCCUS AUREUS  Surgical pcr screen     Status: Abnormal   Collection Time: 09/04/18  2:05 AM  Result Value Ref Range Status   MRSA, PCR POSITIVE (A) NEGATIVE Final    Comment: RESULT CALLED TO, READ BACK BY AND VERIFIED WITHCorky Downs RN [phone] 09/04/18 A BROWNING    Staphylococcus aureus POSITIVE (A) NEGATIVE Final    Comment: (NOTE) The Xpert SA Assay (FDA approved for NASAL specimens in patients 69 years of age and older), is one component of a comprehensive surveillance program. It is not intended to diagnose infection nor to guide or monitor treatment. Performed at [facility], [address], [place], [place] [address]      Labs: BNP (last 3 results) No results for input(s): BNP in the last 8760 hours. Basic  Metabolic Panel: Recent Labs  Lab 09/02/18 0336 09/03/18 0302  NA 131* 135  K 3.6 3.9  CL 98 103  CO2 21* 24  GLUCOSE 129* 109*  BUN 7 5*  CREATININE 0.98 0.80  CALCIUM 9.5 8.8*   Liver Function Tests: No results for input(s): AST, ALT, ALKPHOS, BILITOT, PROT, ALBUMIN in the last 168 hours. No results for input(s): LIPASE, AMYLASE in the last 168 hours. No results for input(s): AMMONIA in the last 168 hours. CBC: Recent Labs  Lab 09/02/18 0336 09/03/18 0302 09/04/18 0226  WBC 14.9* 8.9 7.2  NEUTROABS 11.8*  --  3.6  HGB 14.7 13.6 13.0  HCT 43.9 40.9 39.2  MCV 90.3 91.9 90.7  PLT 377 256 337   Cardiac Enzymes: No results for input(s): CKTOTAL, CKMB, CKMBINDEX, TROPONINI in the last 168 hours. BNP: Invalid input(s): POCBNP CBG: No results for input(s): GLUCAP in the last 168 hours. D-Dimer No results for input(s): DDIMER in the last 72 hours. Hgb A1c No results for input(s): HGBA1C in the last 72 hours. Lipid Profile No results for input(s): CHOL, HDL, LDLCALC, TRIG, CHOLHDL, LDLDIRECT in the last 72 hours. Thyroid function studies No results for input(s): TSH, T4TOTAL, T3FREE, THYROIDAB in the last 72 hours.  Invalid input(s): FREET3 Anemia work up No results for input(s): VITAMINB12, FOLATE, FERRITIN, TIBC, IRON, RETICCTPCT in the last 72 hours. Urinalysis    Component Value Date/Time   COLORURINE YELLOW 03/05/2012 1822   APPEARANCEUR CLEAR 03/05/2012 1822   LABSPEC 1.010 04/26/2018 1627   PHURINE 6.0 04/26/2018 1627   GLUCOSEU NEGATIVE  04/26/2018 1627   HGBUR NEGATIVE 04/26/2018 1627   BILIRUBINUR NEGATIVE 04/26/2018 1627   KETONESUR TRACE (A) 04/26/2018 1627   PROTEINUR NEGATIVE 04/26/2018 1627   UROBILINOGEN 0.2 04/26/2018 1627   NITRITE NEGATIVE 04/26/2018 1627   LEUKOCYTESUR SMALL (A) 04/26/2018 1627   Sepsis Labs Invalid input(s): PROCALCITONIN,  WBC,  LACTICIDVEN Microbiology Recent Results (from the past 240 hour(s))  Culture, blood  (routine x 2)     Status: None   Collection Time: 09/02/18  3:25 AM  Result Value Ref Range Status   Specimen Description SITE NOT SPECIFIED  Final   Special Requests   Final    BOTTLES DRAWN AEROBIC AND ANAEROBIC Blood Culture adequate volume   Culture   Final    NO GROWTH 5 DAYS Performed at [facility], [address], [place], [place] [address]    Report Status 09/07/2018 FINAL  Final  Culture, blood (routine x 2)     Status: None   Collection Time: 09/02/18  4:13 AM  Result Value Ref Range Status   Specimen Description BLOOD RIGHT ARM  Final   Special Requests   Final    BOTTLES DRAWN AEROBIC AND ANAEROBIC Blood Culture adequate volume   Culture   Final    NO GROWTH 5 DAYS Performed at [facility], [address], [place], [place] [address]    Report Status 09/07/2018 FINAL  Final  Aerobic Culture (superficial specimen)     Status: None   Collection Time: 09/02/18  6:10 AM  Result Value Ref Range Status   Specimen Description WOUND  Final   Special Requests NONE  Final   Gram Stain   Final    RARE WBC PRESENT, PREDOMINANTLY PMN MODERATE GRAM POSITIVE COCCI IN CLUSTERS Performed at [facility], [address] [place], [place] [address]    Culture   Final    MODERATE METHICILLIN RESISTANT STAPHYLOCOCCUS AUREUS   Report Status 09/06/2018 FINAL  Final   Organism ID, Bacteria METHICILLIN RESISTANT STAPHYLOCOCCUS AUREUS  Final      Susceptibility   Methicillin resistant staphylococcus aureus - MIC*    CIPROFLOXACIN Value in next row Sensitive      SENSITIVE<=0.5    ERYTHROMYCIN Value in next row Resistant      RESISTANT>=8    GENTAMICIN Value in next row Sensitive      SENSITIVE<=0.5    OXACILLIN Value in next row Resistant      RESISTANT>=4    TETRACYCLINE Value in next row Sensitive      SENSITIVE<=1    VANCOMYCIN Value in next row Sensitive      SENSITIVE<=0.5    TRIMETH/SULFA Value in next row Sensitive      SENSITIVE<=10    CLINDAMYCIN  Value in next row Sensitive      SENSITIVE<=0.25    RIFAMPIN Value in next row Sensitive      SENSITIVE<=0.5    Inducible Clindamycin Value in next row Sensitive      SENSITIVE<=0.5    * MODERATE METHICILLIN RESISTANT STAPHYLOCOCCUS AUREUS  Surgical pcr screen     Status: Abnormal   Collection Time: 09/04/18  2:05 AM  Result Value Ref Range Status   MRSA, PCR POSITIVE (A) NEGATIVE Final    Comment: RESULT CALLED TO, READ BACK BY AND VERIFIED WITHCorky Downs RN [phone] 09/04/18 A BROWNING    Staphylococcus aureus POSITIVE (A) NEGATIVE Final    Comment: (NOTE) The Xpert SA Assay (FDA approved for NASAL  specimens in patients 59 years of age and older), is one component of a comprehensive surveillance program. It is not intended to diagnose infection nor to guide or monitor treatment. Performed at [facility], [address] [place], [place] [address]      Almon Hercules, MD  [facility] 09/08/2018, 4:53 PM Pager   If 7PM-7AM, please contact night-coverage [email or web address] Password [email or web address]

## 2018-09-08 NOTE — Discharge Instructions (Signed)
It has been a pleasure taking care of you! You were admitted due to infection and an abscess in your right.  The abscess was drained.  Infection was treated with antibiotic. With that your symptoms improved to the point we think it is safe to let you go home and follow up with your primary care doctor. We are discharging you on more antibiotic you need to continue taking for the next 2 weeks.  There could be some changes made to your home medications during this hospitalization. Please, make sure to read the directions before you take them. The names and directions on how to take these medications are found on this discharge paper under medication section.  Please follow up at your doctors.  Once you are discharged, your primary care physician will handle any further medical issues. Please note that NO REFILLS for any discharge medications will be authorized once you are discharged, as it is imperative that you return to your primary care physician (or establish a relationship with a primary care physician if you do not have one) for your aftercare needs so that they can reassess your need for medications and monitor your lab values. Take care,

## 2018-09-08 NOTE — Progress Notes (Signed)
Pt given discharge instructions, prescriptions and home medications. Instructions gone over with him about follow up appointments and wound care, he verbalized understanding. All belongings gathered to be sent home.

## 2018-09-08 NOTE — Care Management Note (Signed)
Case Management Note  Patient Details  Name: Encarnacion ChuLuke C Dowell MRN: [id number] Date of Birth: 01/28/1981  Subjective/Objective:   37 yr old male admitted with abscess/cellulitis of right thumb and hand.                  Action/Plan: Case manager made consult to Neuro outpatient rehab for patient to receive hand therapy.   Expected Discharge Date:  09/08/18               Expected Discharge Plan:  OP Rehab  In-House Referral:  PCP / [facility]  Discharge planning Services  CM Consult, Follow-up appt scheduled  Post Acute Care Choice:  NA Choice offered to:     DME Arranged:  N/A DME Agency:  NA  HH Arranged:  NA HH Agency:  NA  Status of Service:  Completed, signed off  If discussed at Long Length of Stay Meetings, dates discussed:    Additional Comments:  Durenda GuthrieBrady, Mikinzie Maciejewski Naomi, RN 09/08/2018, 12:11 PM

## 2018-09-10 ENCOUNTER — Encounter: Payer: Self-pay | Admitting: Occupational Therapy

## 2018-09-10 ENCOUNTER — Ambulatory Visit: Payer: Medicaid Other | Attending: Student | Admitting: Occupational Therapy

## 2018-09-10 ENCOUNTER — Other Ambulatory Visit: Payer: Self-pay

## 2018-09-10 DIAGNOSIS — M25641 Stiffness of right hand, not elsewhere classified: Secondary | ICD-10-CM | POA: Diagnosis present

## 2018-09-10 DIAGNOSIS — M6281 Muscle weakness (generalized): Secondary | ICD-10-CM | POA: Insufficient documentation

## 2018-09-10 DIAGNOSIS — R600 Localized edema: Secondary | ICD-10-CM | POA: Insufficient documentation

## 2018-09-10 DIAGNOSIS — M25631 Stiffness of right wrist, not elsewhere classified: Secondary | ICD-10-CM | POA: Diagnosis present

## 2018-09-10 NOTE — Therapy (Signed)
[facility] [address] [place], [place], [address] Phone: [phone]   Fax:  [phone]  Occupational Therapy Evaluation  Patient Details  Name: Encarnacion ChuLuke C Dunne MRN: [id number] Date of Birth: 01/24/1981 Referring Provider (OT): Dr Mack Hookavid Thompson   Encounter Date: 09/10/2018  OT End of Session - 09/10/18 1540    Visit Number  1    Number of Visits  9    Authorization Type  Medicaid - awaiting authorization    OT Start Time  1447    OT Stop Time  1530    OT Time Calculation (min)  43 min    Activity Tolerance  Patient tolerated treatment well    Behavior During Therapy  [facility] for tasks assessed/performed       Past Medical History:  Diagnosis Date  . Anxiety   . Cellulitis and abscess of hand 08/2018   RIGHT HAND   . Head injury   . Schizophrenia [facility](HCC)     Past Surgical History:  Procedure Laterality Date  . ADENOIDECTOMY    . I&D EXTREMITY Right 09/04/2018   Procedure: IRRIGATION AND DEBRIDEMENT OF  HAND;  Surgeon: Mack Hookhompson, David, MD;  [facility];  Service: Orthopedics;  Laterality: Right;  . TYMPANOSTOMY TUBE PLACEMENT    . urinary surgery as child      There were no vitals filed for this visit.  Subjective Assessment - 09/10/18 1453    Subjective   What to do and not to do to improve mobility    Patient is accompained by:  Family member   mom- Elease Hashimotoatricia   Currently in Pain?  Yes    Pain Score  1     Pain Location  Hand    Pain Orientation  Right    Pain Descriptors / Indicators  Aching;Burning        [facility] OT Assessment - 09/10/18 0001      Assessment   Medical Diagnosis  cellulitis    Referring Provider (OT)  Dr Mack Hookavid Thompson    Onset Date/Surgical Date  09/04/18    Hand Dominance  Right    Next MD Visit  09/23/18    Prior Therapy  na      Precautions   Precautions  Other (comment)   dressing on thumb     Restrictions   Weight Bearing Restrictions  No      Prior Function   Level of Independence  Independent with basic ADLs    Vocation  Unemployed      ADL   ADL comments  Patient is trying to use his hand for basic self care skills daily.        Sensation   Light Touch  Appears Intact      Coordination   Fine Motor Movements are Fluid and Coordinated  No    Other  Limited IP flexion thumb      Edema   Edema  Thumb, thumb base, heel of hand swollen - mild      ROM / Strength   AROM / PROM / Strength  AROM      AROM   Overall AROM   Deficits    Overall AROM Comments  Limited wrist flex/ext, limited thumb MCP/IP motion      Hand Function   Right Hand Gross Grasp  Impaired    Right Hand Grip (lbs)  unable  OT Education - 09/10/18 1536    Education Details  Reviewed edema management techniques - elevation, massage and pumping.  Reviewed motions at MCP thumb, and wrist flex/ext - prayer stretch    Person(s) Educated  Patient;Parent(s)    Methods  Explanation;Demonstration;Verbal cues    Comprehension  Verbalized understanding;Returned demonstration          OT Long Term Goals - 09/10/18 1555      OT LONG TERM GOAL #1   Title  Patient will demonstrate effective edema management techniques due 11/16/2018    Baseline  Patient not completing edema management    Time  8    Period  Weeks    Status  New    Target Date  11/16/18      OT LONG TERM GOAL #2   Title  Patient will demonstrate effective scar management techniques - thumb and wrist    Baseline  Patient not completing scar management    Time  8    Period  Weeks    Status  New      OT LONG TERM GOAL #3   Title  Patient will demonstrate effective wrist and thumb exercises to promote full range of motion in hand    Baseline  Patient with 45 degrees of wrist flex/ext, ~ 10 degrees of IP flex    Time  8    Period  Weeks    Status  New      OT LONG TERM GOAL #4   Title  Patient will demonstrate sufficient grip strength to open a screw top bottle with  pain no greater than 1/10    Baseline  Patient unable to open bottle due to limited ability to make contact using right thumb    Time  8    Period  Weeks    Status  New      OT LONG TERM GOAL #5   Title  Patient will complete a HEP designed to improve strength and coordination in right hand to help return to dominant status    Baseline  Patient does not complete strength trainign or coordination exercises currently    Time  8    Period  Weeks    Status  New            Plan - 09/10/18 1543    Clinical Impression Statement  Patient is a 37 year old with significant psychiatric history referred for OT eval after surgical irrigation and debridement of infected R hand/thumb.  Patient arrived today with gauze dressing on right thumb, incision visible on right volar wrist - incision approximated, clean pink skin.  Patient with edematous right thumb and heel of hand, healing volar aspect of right thumb.  Patient with decreased active and passive range of motion - and per MD no restrictions to movement.  Patient will benefit form skilled OT intervention to reduce pain, and swelling, and improve range of motion and functional use of dominant right hand.      Occupational Profile and client history currently impacting functional performance  son, room mate, brother    Occupational performance deficits (Please refer to evaluation for details):  ADL's;IADL's    Rehab Potential  Good    OT Frequency  1x / week    OT Duration  8 weeks    OT Treatment/Interventions  Self-care/ADL training;Therapeutic exercise;Splinting;Patient/family education;Moist Heat;Fluidtherapy;Scar mobilization;Therapeutic activities;Cryotherapy;Contrast Bath;Manual Therapy;DME and/or AE instruction;Passive range of motion    Plan  Initiate IP blocking exercises, check edema  management strategies, check specific range of motion for thumb/ wrist    Clinical Decision Making  Several treatment options, min-mod task modification  necessary    OT Home Exercise Plan  initiated edema management    Consulted and Agree with Plan of Care  Patient;Family member/caregiver    Family Member Consulted  mom - Elease Hashimoto       Patient will benefit from skilled therapeutic intervention in order to improve the following deficits and impairments:  Pain, Impaired flexibility, Increased edema, Decreased skin integrity, Decreased coordination, Decreased scar mobility, Impaired sensation, Decreased range of motion, Decreased strength, Decreased knowledge of precautions, Impaired UE functional use  Visit Diagnosis: Stiffness of right wrist, not elsewhere classified - Plan: Ot plan of care cert/re-cert  Stiffness of right hand, not elsewhere classified - Plan: Ot plan of care cert/re-cert  Localized edema - Plan: Ot plan of care cert/re-cert  Muscle weakness (generalized) - Plan: Ot plan of care cert/re-cert    Problem List Patient Active Problem List   Diagnosis Date Noted  . Cellulitis of hand 09/02/2018  . Polysubstance abuse (HCC) 09/02/2018  . Tobacco dependence 09/02/2018  . Schizoaffective disorder, bipolar type (HCC) 03/20/2017  . Cannabis use disorder, severe, dependence (HCC) 03/20/2017  . Alcohol use disorder 03/20/2017    Collier Salina, OTR/L 09/10/2018, 4:03 PM  [facility] [address] [place], [place], [address] Phone: [phone]   Fax:  [phone]  Name: HEZEKIAH VELTRE MRN: [id number] Date of Birth: June 11, 1981

## 2018-09-18 ENCOUNTER — Ambulatory Visit: Payer: Medicaid Other | Attending: Physician Assistant | Admitting: Physician Assistant

## 2018-09-18 VITALS — BP 120/75 | HR 70 | Temp 98.3°F | Wt 161.8 lb

## 2018-09-18 DIAGNOSIS — F419 Anxiety disorder, unspecified: Secondary | ICD-10-CM | POA: Diagnosis not present

## 2018-09-18 DIAGNOSIS — F2089 Other schizophrenia: Secondary | ICD-10-CM

## 2018-09-18 DIAGNOSIS — L02519 Cutaneous abscess of unspecified hand: Secondary | ICD-10-CM

## 2018-09-18 DIAGNOSIS — L03113 Cellulitis of right upper limb: Secondary | ICD-10-CM

## 2018-09-18 DIAGNOSIS — F209 Schizophrenia, unspecified: Secondary | ICD-10-CM | POA: Insufficient documentation

## 2018-09-18 DIAGNOSIS — Z9889 Other specified postprocedural states: Secondary | ICD-10-CM | POA: Diagnosis not present

## 2018-09-18 DIAGNOSIS — L03119 Cellulitis of unspecified part of limb: Secondary | ICD-10-CM

## 2018-09-18 DIAGNOSIS — Z79899 Other long term (current) drug therapy: Secondary | ICD-10-CM | POA: Diagnosis not present

## 2018-09-18 DIAGNOSIS — L02413 Cutaneous abscess of right upper limb: Secondary | ICD-10-CM

## 2018-09-18 DIAGNOSIS — F1910 Other psychoactive substance abuse, uncomplicated: Secondary | ICD-10-CM | POA: Diagnosis not present

## 2018-09-18 NOTE — Progress Notes (Signed)
Troy [name]  [id number]  [id number]  DOB - 08/04/1981  Chief Complaint  Patient presents with  . Hospitalization Follow-up       Subjective:   Troy BoatmanLuke Inge is a 37 y.o. male here today for establishment of care. He has a medical history significant for schizophrenia, anxiety, substance abuse and low platelets. [facility]. Hand xray negative for fracture. Was found to have cellulitis and abscess of the right hand. Started on antibiotics, augmentin and given tramadol for pain. 09/02/18 went to ED with continued sxs. Dr. Janee Mornhompson with hand surgery consulted and he also underwent bedside I&D in ER and cultures growing abundant staph species. He was started on antibiotics with IV vancomycin and IV Zosyn. He underwent I&D on 11/21 and his cultures grew MRSA.   He was continued on IV vancomycin until the day of discharge (11/25).  He was evaluated by infectious disease and discharged on doxycycline for 2 more weeks (09/22/18)  Since discharge the pain has improved.  The swelling has slightly improved.  He has his hand dressed but wants on wrap there is an open wound approximately the size of a quarter with some discharge that patient states looks much improved.  No odor.   ROS: GEN: denies fever or chills, denies change in weight Skin:right thumb lesion EXT: denies muscle spasms + swelling; no pain in lower ext, no weakness NEURO: denies numbness or tingling, denies sz, stroke or TIA   ALLERGIES: No Known Allergies  PAST MEDICAL HISTORY: Past Medical History:  Diagnosis Date  . Anxiety   . Cellulitis and abscess of hand 08/2018   RIGHT HAND   . Head injury   . Schizophrenia (HCC)     PAST SURGICAL HISTORY: Past Surgical History:  Procedure Laterality Date  . ADENOIDECTOMY    . I&D EXTREMITY Right 09/04/2018   Procedure: IRRIGATION AND DEBRIDEMENT OF  HAND;  Surgeon: Mack Hookhompson, David, MD;  [facility];  Service: Orthopedics;  Laterality:  Right;  . TYMPANOSTOMY TUBE PLACEMENT    . urinary surgery as child      MEDICATIONS AT HOME: Prior to Admission medications   Medication Sig Start Date End Date Taking? Authorizing Provider  asenapine (SAPHRIS) 5 MG SUBL 24 hr tablet Take 1 tablet (5 mg) under the tongue in the morning & 2 tablets (10 mg) under the tongue at bedtime: For mood control Patient taking differently: Place 5 mg under the tongue every morning.  03/26/17   Armandina StammerNwoko, Agnes I, NP  bacitracin ointment Apply topically 3 (three) times daily. 09/07/18   Kathlen ModyAkula, Vijaya, MD  doxycycline (VIBRAMYCIN) 50 MG capsule Take 2 capsules (100 mg total) by mouth 2 (two) times daily for 13 days. 09/08/18 09/21/18  Kathlen ModyAkula, Vijaya, MD  gabapentin (NEURONTIN) 300 MG capsule Take 300 mg by mouth 3 (three) times daily.    [provider]  hydrOXYzine (ATARAX/VISTARIL) 25 MG tablet Take 25 mg by mouth 3 (three) times daily as needed for anxiety.     [provider]  mupirocin ointment (BACTROBAN) 2 % Place 1 application into the nose 2 (two) times daily. 09/07/18   Kathlen ModyAkula, Vijaya, MD  naproxen sodium (ALEVE) 220 MG tablet Take 440 mg by mouth daily as needed (pain or discomfort).    [provider]  Valbenazine Tosylate (INGREZZA) 80 MG CAPS Take 80 mg by mouth daily.    [provider]    Family History  Problem Relation Age of Onset  .  Healthy Mother   . Healthy Father     Social-unmarried, smoker and MJ use. Meth use. Unemployed.  Objective:   Vitals:   09/18/18 0912  BP: 120/75  Pulse: 70  Temp: 98.3 F (36.8 C)  TempSrc: Oral  SpO2: 100%  Weight: 161 lb 12.8 oz (73.4 kg)    Exam General appearance : Awake, alert, not in any distress. Speech Clear. Not toxic looking HEENT: Atraumatic and Normocephalic, pupils equally reactive to light and accomodation Neck: supple, no JVD. No cervical lymphadenopathy.  Chest:Good air entry bilaterally, no added sounds  CVS: S1 S2 regular, no murmurs.    Abdomen: Bowel sounds present, Non tender and not distended with no guarding, rigidity or rebound. Extremities: B/L Lower Ext shows no edema, both legs are warm to touch Neurology: Awake alert, and oriented X 3, CN II-XII intact, Non focal Skin:No Rash Wounds:N/A  Data Review Lab Results  Component Value Date   HGBA1C 4.9 03/22/2017     Assessment & Plan  1. Right hand cellulitis  -cont wound care as recommended  -keep ortho and ID follow up  -complete doxycycline 2. Polysubstance abuse/schizophremnia  -Has a therapist  -no change in regimen   F/u prn. He has a PCP. Dr. Corky Downs.  The patient was given clear instructions to go to ER or return to medical center if symptoms don't improve, worsen or new problems develop. The patient verbalized understanding. The patient was told to call to get lab results if they haven't heard anything in the next week.   This note has been created with Education officer, environmental. Any transcriptional errors are unintentional.    Scot Jun, PA-C [facility] [place], [place] [phone]   09/18/2018, 1:10 PM

## 2018-09-24 ENCOUNTER — Ambulatory Visit: Payer: Medicaid Other | Admitting: Occupational Therapy

## 2018-09-29 ENCOUNTER — Encounter: Payer: Self-pay | Admitting: Occupational Therapy

## 2018-09-29 ENCOUNTER — Ambulatory Visit: Payer: Medicaid Other | Attending: Student | Admitting: Occupational Therapy

## 2018-09-29 DIAGNOSIS — M25641 Stiffness of right hand, not elsewhere classified: Secondary | ICD-10-CM | POA: Diagnosis present

## 2018-09-29 DIAGNOSIS — M6281 Muscle weakness (generalized): Secondary | ICD-10-CM | POA: Diagnosis present

## 2018-09-29 DIAGNOSIS — M25631 Stiffness of right wrist, not elsewhere classified: Secondary | ICD-10-CM | POA: Diagnosis not present

## 2018-09-29 DIAGNOSIS — R600 Localized edema: Secondary | ICD-10-CM | POA: Diagnosis present

## 2018-09-29 NOTE — Therapy (Signed)
[facility] [address] [place], [place], [address] Phone: [phone]   Fax:  [phone]  Occupational Therapy Treatment  Patient Details  Name: Troy [name] MRN: [id number] Date of Birth: 07-Nov-1980 Referring Provider (OT): Dr Mack Hook   Encounter Date: 09/29/2018  OT End of Session - 09/29/18 1640    Visit Number  2    Number of Visits  9    Authorization Type  Medicaid - awaiting authorization    Authorization Time Period  [employer] approved 3 visits until 10/14/18 - will seek add'l visits for 2020    Authorization - Visit Number  1    Authorization - Number of Visits  3    OT Start Time  1447    OT Stop Time  1530    OT Time Calculation (min)  43 min    Activity Tolerance  Patient tolerated treatment well    Behavior During Therapy  Flat affect       Past Medical History:  Diagnosis Date  . Anxiety   . Cellulitis and abscess of hand 08/2018   RIGHT HAND   . Head injury   . Schizophrenia [facility])     Past Surgical History:  Procedure Laterality Date  . ADENOIDECTOMY    . I&D EXTREMITY Right 09/04/2018   Procedure: IRRIGATION AND DEBRIDEMENT OF  HAND;  Surgeon: Mack Hook, MD;  Location: [facility] OR;  Service: Orthopedics;  Laterality: Right;  . TYMPANOSTOMY TUBE PLACEMENT    . urinary surgery as child      There were no vitals filed for this visit.  Subjective Assessment - 09/29/18 1455    Subjective   My back is hurting from dancing in my room last night    Currently in Pain?  Yes    Pain Score  2     Pain Location  Back    Pain Orientation  Mid    Pain Descriptors / Indicators  Aching    Pain Onset  Yesterday    Pain Frequency  Intermittent         [facility] OT Assessment - 09/29/18 0001      AROM   Overall AROM Comments  wrist extension - 40, flexion - 60      Hand Function   Right Hand Gross Grasp  Impaired    Right Hand Grip (lbs)  40    Right Hand Lateral Pinch  12 lbs    Right  Hand 3 Point Pinch  8 lbs    Left Hand Gross Grasp  Functional    Left Hand Grip (lbs)  65    Left Hand Lateral Pinch  22 lbs    Left 3 point pinch  14 lbs               OT Treatments/Exercises (OP) - 09/29/18 0001      Wrist Exercises   Other wrist exercises  Educated patient on wrist and thumb active assisted range of motion - suppoorting with left hand.  Worked on active then active assisted range of motion for wrist extension.        Hand Exercises   DIPJ Flexion Limitations  limited thumb IP flexion - moderate edema still present throughout shaft of thumb.      Other Hand Exercises  Thumb flexion, abduction - active to active assisted.  Patient somewhat limited by pain 3/10 with stretch.        Manual Therapy   Edema Management  Reviewed edema management.  Edema massage to thumb shaft to address pocket of fluid just proximal to IP joint.       Other Manual Therapy  Scar mobilization - cross friction at volar wrist.               OT Education - 09/29/18 1640    Education Details  reviewed edema management, wrist and thumb exercises active to active assisted range of motion    Person(s) Educated  Patient    Methods  Explanation;Demonstration;Handout    Comprehension  Verbalized understanding;Verbal cues required;Tactile cues required;Need further instruction;Returned demonstration          OT Long Term Goals - 09/29/18 1643      OT LONG TERM GOAL #1   Title  Patient will demonstrate effective edema management techniques due 11/16/2018    Baseline  Patient not completing edema management    Time  8    Period  Weeks    Status  Achieved      OT LONG TERM GOAL #2   Title  Patient will demonstrate effective scar management techniques - thumb and wrist    Baseline  Patient not completing scar management    Time  8    Period  Weeks    Status  On-going      OT LONG TERM GOAL #3   Title  Patient will demonstrate effective wrist and thumb exercises to promote  full range of motion in hand    Baseline  Patient with 45 degrees of wrist flex/ext, ~ 10 degrees of IP flex    Time  8    Period  Weeks    Status  On-going      OT LONG TERM GOAL #4   Title  Patient will demonstrate sufficient grip strength to open a screw top bottle with pain no greater than 1/10    Baseline  Patient unable to open bottle due to limited ability to make contact using right thumb    Time  8    Period  Weeks    Status  Achieved      OT LONG TERM GOAL #5   Title  Patient will complete a HEP designed to improve strength and coordination in right hand to help return to dominant status    Baseline  Patient does not complete strength trainign or coordination exercises currently    Time  8    Period  Weeks    Status  On-going            Plan - 09/29/18 1641    Clinical Impression Statement  Patient is healing well - incision at wrist well apporximated - thumb is closing.  Edema is reduced since OT eval, pain appears to be well controlled.  Working to increase range of motion and strength in thumb and wrist.      Occupational Profile and client history currently impacting functional performance  son, room mate, brother    Occupational performance deficits (Please refer to evaluation for details):  ADL's;IADL's    Rehab Potential  Good    OT Frequency  1x / week    OT Duration  8 weeks    OT Treatment/Interventions  Self-care/ADL training;Therapeutic exercise;Splinting;Patient/family education;Moist Heat;Fluidtherapy;Scar mobilization;Therapeutic activities;Cryotherapy;Contrast Bath;Manual Therapy;DME and/or AE instruction;Passive range of motion    Plan  IP blocking - flexion thumb, active to passive wrist ext/flex, thumb abd/ext/flex, check thumb scar    Clinical Decision Making  Several treatment options, min-mod task modification  necessary    OT Home Exercise Plan  initiated edema management, added thumb / wrist exercises    Consulted and Agree with Plan of Care   Patient       Patient will benefit from skilled therapeutic intervention in order to improve the following deficits and impairments:  Pain, Impaired flexibility, Increased edema, Decreased skin integrity, Decreased coordination, Decreased scar mobility, Impaired sensation, Decreased range of motion, Decreased strength, Decreased knowledge of precautions, Impaired UE functional use  Visit Diagnosis: Stiffness of right wrist, not elsewhere classified  Stiffness of right hand, not elsewhere classified  Localized edema  Muscle weakness (generalized)    Problem List Patient Active Problem List   Diagnosis Date Noted  . Cellulitis of hand 09/02/2018  . Polysubstance abuse (HCC) 09/02/2018  . Tobacco dependence 09/02/2018  . Schizoaffective disorder, bipolar type (HCC) 03/20/2017  . Cannabis use disorder, severe, dependence (HCC) 03/20/2017  . Alcohol use disorder 03/20/2017    Collier Salina, OTR/L 09/29/2018, 4:45 PM  [facility] [address] [place], [place], [address] Phone: [phone]   Fax:  [phone]  Name: Troy [name] MRN: [id number] Date of Birth: Mar 18, 1981

## 2018-09-29 NOTE — Patient Instructions (Signed)
Thumb Abduction: (Eccentric), Active-Assist    With hand resting on table on side of hand, gently pull thumb up with other hand. Slowly lower thumb for 3-5 seconds. 10 reps per set, 3 sets per day, 7 days per week.  http://ecce.exer.us/272   Copyright  VHI. All rights reserved.

## 2018-10-01 NOTE — Progress Notes (Signed)
Patient: Troy [name]  DOB: 12-Feb-1981 MRN: [id number] PCP: Patient, No Pcp Per  Referring Provider: HFU  Patient Active Problem List   Diagnosis Date Noted  . Cellulitis of hand 09/02/2018  . Polysubstance abuse ([place]) 09/02/2018  . Tobacco dependence 09/02/2018  . Schizoaffective disorder, bipolar type ([place]) 03/20/2017  . Cannabis use disorder, severe, dependence ([place]) 03/20/2017  . Alcohol use disorder 03/20/2017     Subjective:  CC:  Follow up from hospitalization of hand abscess and cellulitis.   HPI:  JONCARLO FRIBERG is a 37 y.o. male with pmhx significant for anxiety, schizophrenia, thrombocytopenia and substance abuse (injection).   Initially presented to urgent care 08/30/18 with hand pain and swelling. An xray was negative for fracture. Was given amoxacillin-clavulanate and tramadol for treatment at the time. Three days later returned to ER and underwent bedside I&D by general surgery; expressed material identified on culture as MRSA (S-doxy, bactrim, clinda). He received IV vancomycin with a dose of oritavancin prior to discharge on 11/08/17 with instructions to provide 2 more weeks of PO doxycycline. HIV was negative. Tdap was up to date. He was never tested for Hepatitis C.   He and his mother are worried about his swelling and wondering if this is normal or if there is more trouble with infection. He has swelling in the thumb and palm; overall with improved ROM, no pain, no discharge/drainage or erythema. He has no feeling in the tip of his thumb. Had follow up with the hand surgeon but not certain about expectations of healing. He completed his antibiotics and received 19 days of total therapy (5 days IV vancomycin and 14 days oral doxycycline). He tolerated this well w/o side effect.   Review of Systems  Constitutional: Negative for chills, fever, malaise/fatigue and weight loss.  HENT: Negative for sore throat.        No dental problems  Respiratory: Negative for  cough and sputum production.   Cardiovascular: Negative for chest pain and leg swelling.  Gastrointestinal: Negative for abdominal pain, diarrhea and vomiting.  Genitourinary: Negative for dysuria and flank pain.  Musculoskeletal: Negative for joint pain, myalgias and neck pain.       Hand swelling as above  Skin: Negative for rash.  Neurological: Negative for dizziness, tingling and headaches.       Numbness R hand as above  Psychiatric/Behavioral: Negative for depression and substance abuse. The patient is not nervous/anxious and does not have insomnia.     Past Medical History:  Diagnosis Date  . Anxiety   . Cellulitis and abscess of hand 08/2018   RIGHT HAND   . Head injury   . Schizophrenia [facility])     Outpatient Medications Prior to Visit  Medication Sig Dispense Refill  . asenapine (SAPHRIS) 5 MG SUBL 24 hr tablet Take 1 tablet (5 mg) under the tongue in the morning & 2 tablets (10 mg) under the tongue at bedtime: For mood control (Patient taking differently: Place 5 mg under the tongue every morning. ) 90 tablet 0  . gabapentin (NEURONTIN) 300 MG capsule Take 300 mg by mouth 3 (three) times daily.    . hydrOXYzine (ATARAX/VISTARIL) 25 MG tablet Take 25 mg by mouth 3 (three) times daily as needed for anxiety.     . mupirocin ointment (BACTROBAN) 2 % Place 1 application into the nose 2 (two) times daily. 22 g 0  . naproxen sodium (ALEVE) 220 MG tablet Take 440 mg by mouth daily as  needed (pain or discomfort).    [place] Tosylate (INGREZZA) 80 MG CAPS Take 80 mg by mouth daily.    . bacitracin ointment Apply topically 3 (three) times daily. 120 g 0   No facility-administered medications prior to visit.      No Known Allergies  Social History   Tobacco Use  . Smoking status: Current Every Day Smoker    Packs/day: 1.00    Years: 20.00    Pack years: 20.00    Types: Cigarettes  . Smokeless tobacco: Never Used  Substance Use Topics  . Alcohol use: Yes    Comment:  occasionally; once q 1-2 wks  . Drug use: Yes    Types: Marijuana, Methamphetamines    Comment: uses meth regularly, also marijuana    Objective:   Vitals:   10/02/18 1356  BP: 116/79  Pulse: (!) 102  Weight: 153 lb (69.4 kg)   Body mass index is 21.95 kg/m.  Physical Exam Nursing note reviewed.  Constitutional:      Appearance: He is well-developed.  Cardiovascular:     Rate and Rhythm: Normal rate.  Pulmonary:     Effort: Pulmonary effort is normal.  Musculoskeletal:     Comments: Right hand pictured below. There is swelling of the right thumb without any warmth, erythema/discoloration, drainage or pain. Scab to central incision. Dry. Firm nodules around incision.   Skin:    General: Skin is warm and dry.     Findings: No rash.  Neurological:     Mental Status: He is alert and oriented to person, place, and time.  Psychiatric:        Judgment: Judgment normal.   Right hand     Lab Results: Lab Results  Component Value Date   WBC 6.1 10/02/2018   HGB 14.5 10/02/2018   HCT 41.9 10/02/2018   MCV 88.8 10/02/2018   PLT 347 10/02/2018    Lab Results  Component Value Date   CREATININE 0.80 09/03/2018   BUN 5 (L) 09/03/2018   NA 135 09/03/2018   K 3.9 09/03/2018   CL 103 09/03/2018   CO2 24 09/03/2018    Lab Results  Component Value Date   ALT <5 (L) 03/19/2017   AST 23 03/19/2017   ALKPHOS 49 03/19/2017   BILITOT 0.9 03/19/2017     Assessment & Plan:   Problem List Items Addressed This Visit      Unprioritized   Cellulitis of hand - Primary    Significant improvement following debridement and 21 days of antimicrobial therapy for MRSA isolated from his intraoperative culture. I discussed that although this has ongoing swelling to the thumb I am pretty confident that the infection is cured with the lack of pain, warmth, erythema, tenderness of the skin/joint. He has full ROM of the joint but numbness of the tip of the thumb which I think is probably  still expected at this point. With a history of packing wound and firm nodules palpated will check Xray series of hand today to ensure no foreign body/packing material retained and that there is no concern for bony erosion due to a deeper infection. Check CBC/ESR today. I encouraged them to please follow up with hand surgery team for ongoing therapy/compression therapy recommendations for healing.  Will call with results and determine next steps.       Relevant Orders   DG Hand Complete Right (Completed)   Sedimentation rate (Completed)   CBC (Completed)      Colletta [place]  Doren Custard, MSN, NP-C [facility] for Infectious [place] Pager: [phone] Office: [phone]  10/03/18  4:00 PM

## 2018-10-02 ENCOUNTER — Ambulatory Visit: Payer: Medicaid Other | Admitting: Infectious Diseases

## 2018-10-02 ENCOUNTER — Encounter: Payer: Self-pay | Admitting: Infectious Diseases

## 2018-10-02 ENCOUNTER — Ambulatory Visit
Admission: RE | Admit: 2018-10-02 | Discharge: 2018-10-02 | Disposition: A | Discharge status: Home / Self Care | Payer: Medicaid Other | Source: Ambulatory Visit | Attending: Infectious Diseases | Admitting: Infectious Diseases

## 2018-10-02 VITALS — BP 116/79 | HR 102 | Wt 153.0 lb

## 2018-10-02 DIAGNOSIS — L03119 Cellulitis of unspecified part of limb: Secondary | ICD-10-CM | POA: Diagnosis not present

## 2018-10-02 NOTE — Patient Instructions (Addendum)
Nice to meet you today.   Will check some blood work to help give more reassurrance infection is not ongoing. I think the swelling is due to the process of healing. I would encourage you to return to the hand surgeon to help better understand the expectation of healing. There may be a compression sleeve for your hand or other things to help with the healing and return of function.   It seems like your range of motion is returning and I am not surprised your fingers are still numb.   Will check your hand xray today to ensure no new process with the bone.   Will call with results and next steps.   Would ask your primary care team to check hepatitis c antibody for health maintenance.

## 2018-10-03 LAB — CBC
HCT: 41.9 % (ref 38.5–50.0)
HEMOGLOBIN: 14.5 g/dL (ref 13.2–17.1)
MCH: 30.7 pg (ref 27.0–33.0)
MCHC: 34.6 g/dL (ref 32.0–36.0)
MCV: 88.8 fL (ref 80.0–100.0)
MPV: 9.8 fL (ref 7.5–12.5)
Platelets: 347 10*3/uL (ref 140–400)
RBC: 4.72 10*6/uL (ref 4.20–5.80)
RDW: 12.8 % (ref 11.0–15.0)
WBC: 6.1 10*3/uL (ref 3.8–10.8)

## 2018-10-03 LAB — SEDIMENTATION RATE: SED RATE: 2 mm/h (ref 0–15)

## 2018-10-03 NOTE — Assessment & Plan Note (Signed)
Significant improvement following debridement and 21 days of antimicrobial therapy for MRSA isolated from his intraoperative culture. I discussed that although this has ongoing swelling to the thumb I am pretty confident that the infection is cured with the lack of pain, warmth, erythema, tenderness of the skin/joint. He has full ROM of the joint but numbness of the tip of the thumb which I think is probably still expected at this point. With a history of packing wound and firm nodules palpated will check Xray series of hand today to ensure no foreign body/packing material retained and that there is no concern for bony erosion due to a deeper infection. Check CBC/ESR today. I encouraged them to please follow up with hand surgery team for ongoing therapy/compression therapy recommendations for healing.  Will call with results and determine next steps.

## 2018-10-03 NOTE — Progress Notes (Signed)
Labs and x-ray reassuring that there is no evidence of ongoing infection playing a role for Troy [name]'s swelling. I called and discussed this with him over the phone and recommended he continue following up with his hand surgeon to discuss further about expectations about healing. He understood and thanked me for my call.

## 2018-10-13 ENCOUNTER — Ambulatory Visit: Payer: Medicaid Other | Admitting: Occupational Therapy

## 2018-10-13 ENCOUNTER — Encounter: Payer: Self-pay | Admitting: Occupational Therapy

## 2018-10-13 DIAGNOSIS — R600 Localized edema: Secondary | ICD-10-CM

## 2018-10-13 DIAGNOSIS — M25631 Stiffness of right wrist, not elsewhere classified: Secondary | ICD-10-CM | POA: Diagnosis not present

## 2018-10-13 DIAGNOSIS — M6281 Muscle weakness (generalized): Secondary | ICD-10-CM

## 2018-10-13 DIAGNOSIS — M25641 Stiffness of right hand, not elsewhere classified: Secondary | ICD-10-CM

## 2018-10-13 NOTE — Therapy (Signed)
[facility] [address] [place], [place], [address] Phone: [phone]   Fax:  [phone]  Occupational Therapy Treatment  Patient Details  Name: Troy [name] MRN: [id number] Date of Birth: Oct 09, 1981 Referring Provider (OT): Dr Milly Jakob   Encounter Date: 10/13/2018  OT End of Session - 10/13/18 1618    Visit Number  3    Number of Visits  9    Authorization Type  Medicaid - awaiting authorization    Authorization Time Period  [employer] approved 3 visits until 10/14/18 - will seek add'l visits for 2020    Authorization - Visit Number  2    Authorization - Number of Visits  3    OT Start Time  7672    OT Stop Time  1607    OT Time Calculation (min)  34 min    Activity Tolerance  Patient tolerated treatment well    Behavior During Therapy  [facility] for tasks assessed/performed       Past Medical History:  Diagnosis Date  . Anxiety   . Cellulitis and abscess of hand 08/2018   RIGHT HAND   . Head injury   . Schizophrenia [facility])     Past Surgical History:  Procedure Laterality Date  . ADENOIDECTOMY    . I&D EXTREMITY Right 09/04/2018   Procedure: IRRIGATION AND DEBRIDEMENT OF  HAND;  Surgeon: Milly Jakob, MD;  Location: [facility];  Service: Orthopedics;  Laterality: Right;  . TYMPANOSTOMY TUBE PLACEMENT    . urinary surgery as child      There were no vitals filed for this visit.  Subjective Assessment - 10/13/18 1611    Subjective   I am careful picking stuff up like a coffee pot - can't feel the tip.    Currently in Pain?  No/denies    Pain Score  0-No pain                   OT Treatments/Exercises (OP) - 10/13/18 0001      Splinting   Splinting  Fabricated a volar thumb cuff to hold a silicone pad on scar.  Patient able to doff and don, and verbalized understanding of wearing schedule.  Issued patient compression sleeve for thumb to aide with swelling.  Patient has seen infectious disease  MD and no overt signs of infection present.  Patient with edema, mild, but pain resolved, no redness, no heat noted.  Brought patient's mom back to explain silicone pad/ thumb cuff, and compression sleeve.  Patient sent with additional supplies.  rEVIEWED ip blocking exercises.  Reinforced benefit of functionally using right hand.  Patient has met most OT goals and is agreeable to discharge.               OT Education - 10/13/18 1618    Education Details  scar management, edema management    Person(s) Educated  Patient;Parent(s)    Methods  Explanation;Demonstration    Comprehension  Verbalized understanding;Returned demonstration          OT Long Term Goals - 10/13/18 1621      OT LONG TERM GOAL #1   Title  Patient will demonstrate effective edema management techniques due 11/16/2018    Status  Achieved      OT LONG TERM GOAL #2   Title  Patient will demonstrate effective scar management techniques - thumb and wrist    Status  Achieved      OT LONG TERM GOAL #  3   Title  Patient will demonstrate effective wrist and thumb exercises to promote full range of motion in hand    Status  Achieved      OT LONG TERM GOAL #4   Title  Patient will demonstrate sufficient grip strength to open a screw top bottle with pain no greater than 1/10    Status  Achieved      OT LONG TERM GOAL #5   Title  Patient will complete a HEP designed to improve strength and coordination in right hand to help return to dominant status    Status  Achieved            Plan - 10/13/18 1619    Clinical Impression Statement  Patient has met OT goals, and he and his mom are agreeable to OT discharge.      Occupational Profile and client history currently impacting functional performance  son, room mate, brother    Occupational performance deficits (Please refer to evaluation for details):  ADL's;IADL's    Rehab Potential  Good    OT Frequency  1x / week    OT Duration  8 weeks    OT  Treatment/Interventions  Self-care/ADL training;Therapeutic exercise;Splinting;Patient/family education;Moist Heat;Fluidtherapy;Scar mobilization;Therapeutic activities;Cryotherapy;Contrast Bath;Manual Therapy;DME and/or AE instruction;Passive range of motion    Plan  discharge OT       Patient will benefit from skilled therapeutic intervention in order to improve the following deficits and impairments:  Pain, Impaired flexibility, Increased edema, Decreased skin integrity, Decreased coordination, Decreased scar mobility, Impaired sensation, Decreased range of motion, Decreased strength, Decreased knowledge of precautions, Impaired UE functional use  Visit Diagnosis: Stiffness of right hand, not elsewhere classified  Stiffness of right wrist, not elsewhere classified  Localized edema  Muscle weakness (generalized)    Problem List Patient Active Problem List   Diagnosis Date Noted  . Cellulitis of hand 09/02/2018  . Polysubstance abuse ([place]) 09/02/2018  . Tobacco dependence 09/02/2018  . Schizoaffective disorder, bipolar type ([place]) 03/20/2017  . Cannabis use disorder, severe, dependence ([place]) 03/20/2017  . Alcohol use disorder 03/20/2017  OCCUPATIONAL THERAPY DISCHARGE SUMMARY  Visits from Start of Care: 3  Current functional level related to goals / functional outcomes: Patient has returned to nearly full functional use of right hand   Remaining deficits: Numbness tip of thumb, IP Flexion limited thumb, swelling shaft of thumb   Education / Equipment: Edema management, range of motion wrist/ thumb, strengthening Plan: Patient agrees to discharge.  Patient goals were not met. Patient is being discharged due to meeting the stated rehab goals.  ?????      Mariah Milling, OTR/L 10/13/2018, 4:22 PM  [facility] [address] [place], [place], [address] Phone: [phone]   Fax:  [phone]  Name: Troy [name] MRN: [id number] Date of Birth: Dec 27, 1980

## 2018-10-13 NOTE — Patient Instructions (Signed)
Thumb cuff with silicone pad - to be worn during times when not using right hand - sleep, reading, etc.  Can be wear 1-2 hours at a time.   Thumb sleeve - can be worn any time to help reduce swelling in shaft of thumb.  Watch for signs of circulation problems - finger tip changes color, feels very cold.   Discontinue use if this occurs.

## 2019-05-15 IMAGING — CR DG HAND COMPLETE 3+V*R*
3 series · 3 of 3 positions shown · non-contrast
Comparison: 08/30/2018

CLINICAL DATA: Cellulitis RIGHT hand, question underlying bony
abnormality, abscess drainage

EXAM:
RIGHT HAND - COMPLETE 3+ VIEW

[x hand pa right]
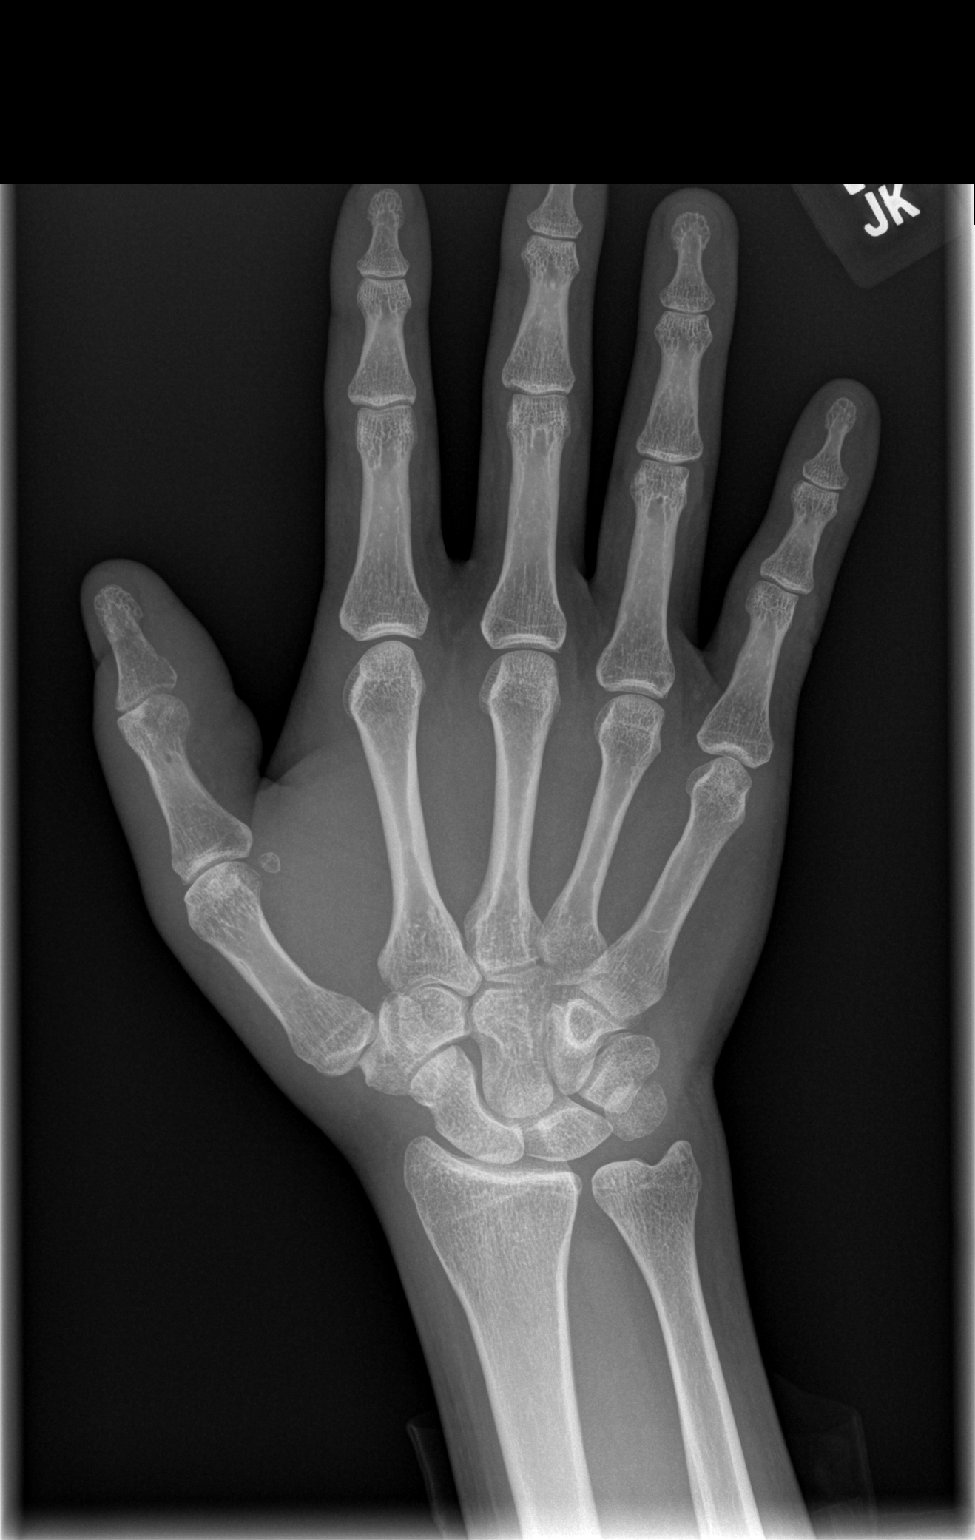

[x hand oblique right]
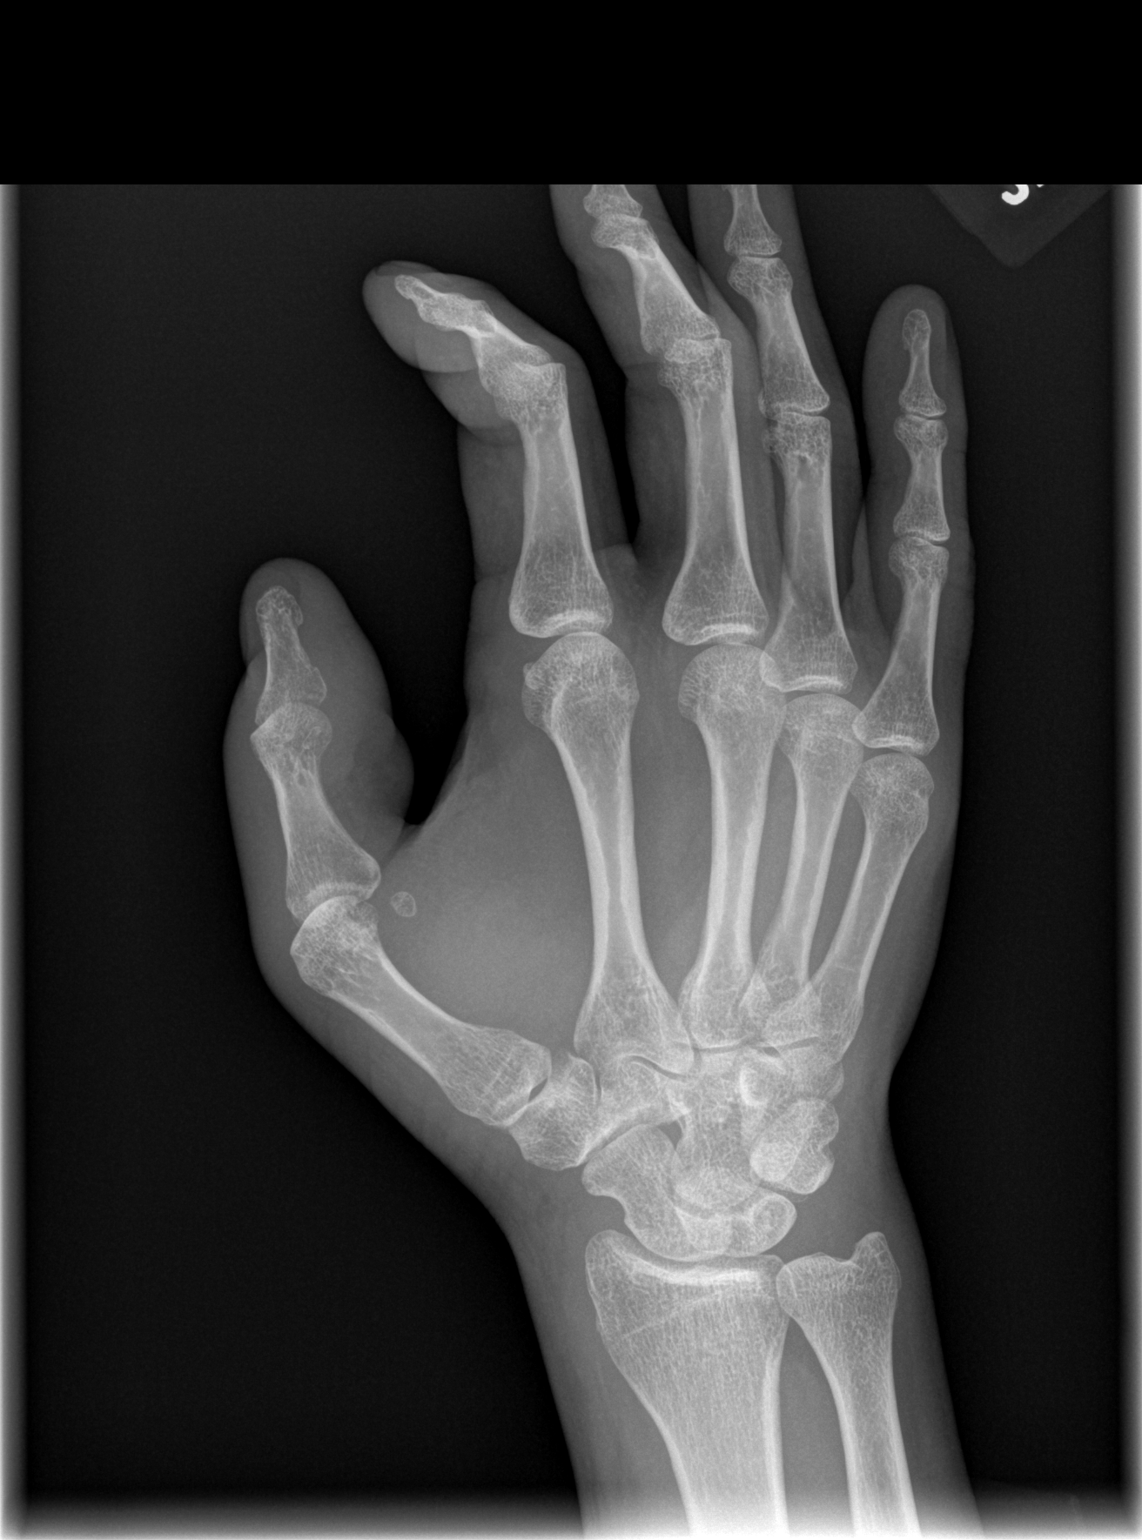

[x hand lat right]
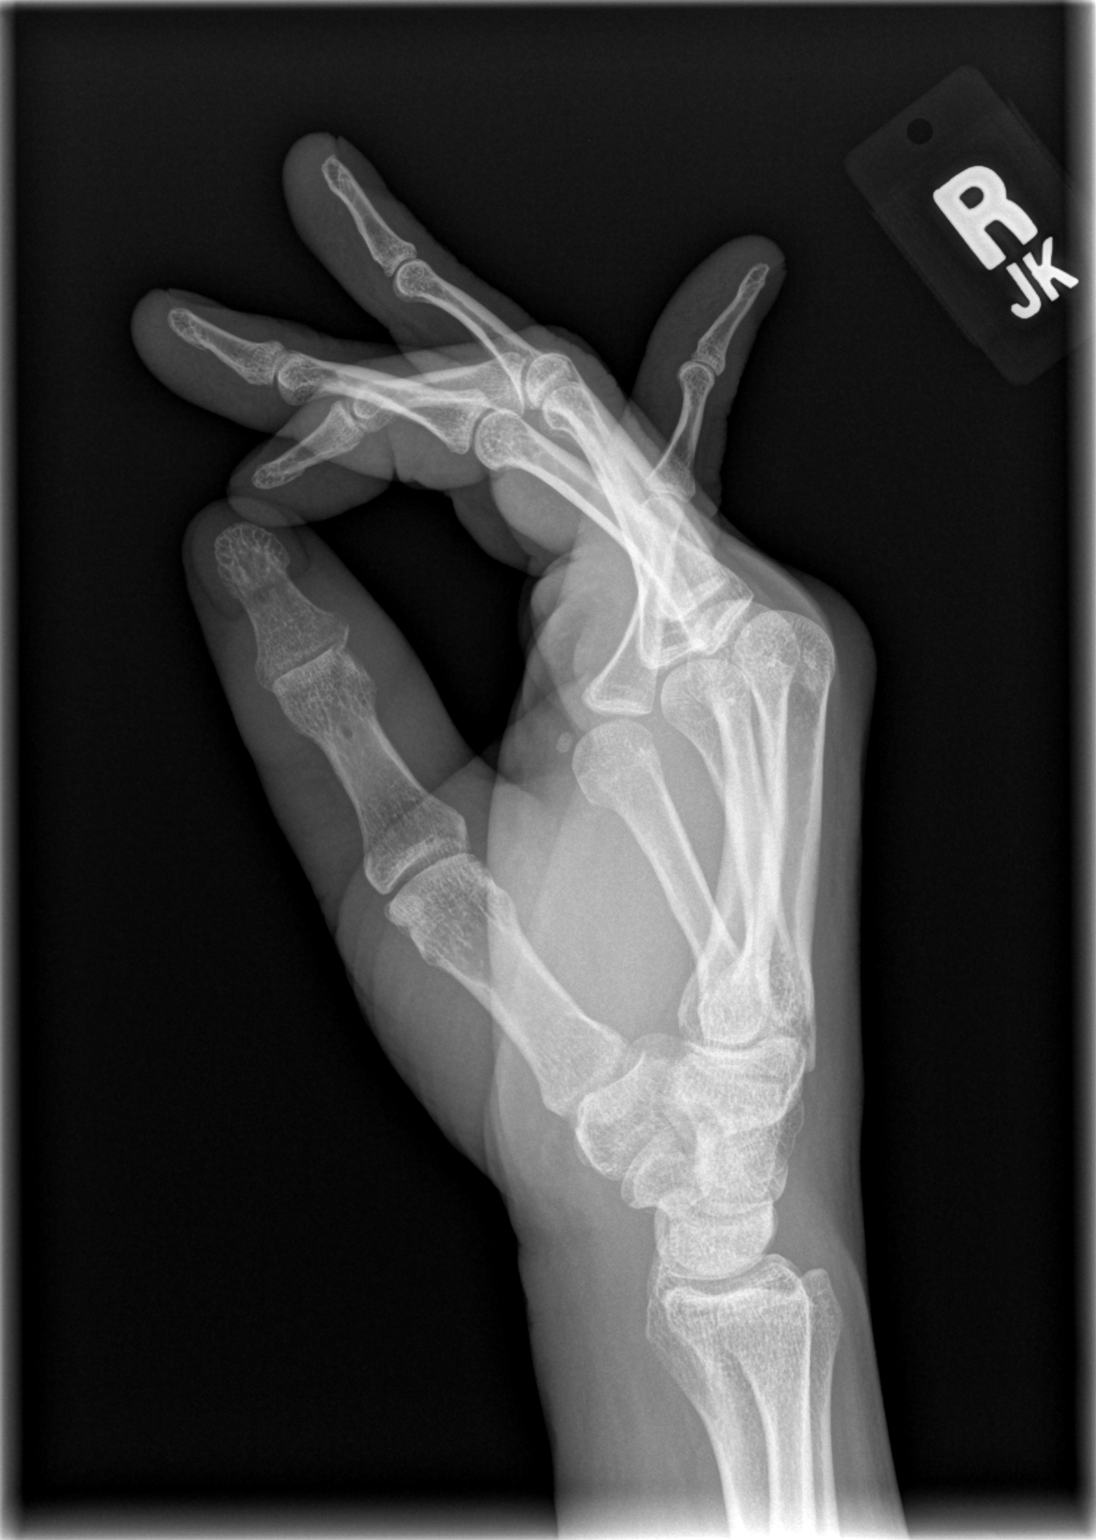

[3 of 3 positions shown; findings below may reference images not displayed]

FINDINGS: Soft tissue swelling RIGHT thumb extending into hand.

Osseous mineralization normal.

Joint spaces preserved.

No acute fracture, dislocation, or bone destruction.
IMPRESSION: No acute osseous abnormalities.

## 2020-04-12 ENCOUNTER — Other Ambulatory Visit: Payer: Self-pay

## 2020-04-12 ENCOUNTER — Ambulatory Visit (HOSPITAL_COMMUNITY)
Admission: EM | Admit: 2020-04-12 | Discharge: 2020-04-12 | Disposition: A | Discharge status: Home / Self Care | Payer: Medicaid Other

## 2020-04-12 ENCOUNTER — Encounter (HOSPITAL_COMMUNITY): Payer: Self-pay | Admitting: Emergency Medicine

## 2020-04-12 DIAGNOSIS — W57XXXA Bitten or stung by nonvenomous insect and other nonvenomous arthropods, initial encounter: Secondary | ICD-10-CM

## 2020-04-12 DIAGNOSIS — L03115 Cellulitis of right lower limb: Secondary | ICD-10-CM | POA: Diagnosis not present

## 2020-04-12 MED ORDER — SULFAMETHOXAZOLE-TRIMETHOPRIM 800-160 MG PO TABS: 1.0000 | ORAL_TABLET | Freq: Two times a day (BID) | ORAL | 0 refills | Status: AC

## 2020-04-12 NOTE — Discharge Instructions (Addendum)
Take the antibiotic 2 times a day for 7 days  I have placed a referral for you to be seen by Woundcare - they should be calling you, but you may contact the number on this paper to have appointment made  This wound should be reassessed in 2-3 days, return to urgent care if unable to be seen by wound care within 3 days  Clean the wounds daily, bandage wounds

## 2020-04-12 NOTE — ED Triage Notes (Signed)
Pt c/o ant bites on both legs x 2 weeks ago. Pt states that they have become red and swollen. One bite on posterior right leg is draining some pus. Pt states legs feel warm to the touch.

## 2020-04-12 NOTE — ED Notes (Signed)
Non adherent dressing applied to patients right lower wound. Pt tolerated well.

## 2020-04-12 NOTE — ED Provider Notes (Signed)
[facility]    CSN: [id number] Arrival date & time: 04/12/20  1456      History   Chief Complaint Chief Complaint  Patient presents with  . Insect Bite    HPI Troy [name] is a 39 y.o. male.   Patient presents for evaluation of infected ant bites.  He reports he was bitten by ants on his legs about 2 weeks ago.  Reports over the last several days a few of the bites have begun to swell and become painful.  He reports the bigger one on his right lower leg became swollen enough that he tried to drain.  He reports getting some fluid out.  He reports since draining it does not hurt as much.  He does report having some chills at night.  Denies fever.  He reports he has 3 main wounds that he is worried about, 2 on the right leg and one on the left lower leg.     Past Medical History:  Diagnosis Date  . Anxiety   . Cellulitis and abscess of hand 08/2018   RIGHT HAND   . Head injury   . Schizophrenia [facility])     Patient Active Problem List   Diagnosis Date Noted  . Cellulitis of hand 09/02/2018  . Polysubstance abuse ([facility]) 09/02/2018  . Tobacco dependence 09/02/2018  . Schizoaffective disorder, bipolar type ([facility]) 03/20/2017  . Cannabis use disorder, severe, dependence ([facility]) 03/20/2017  . Alcohol use disorder 03/20/2017    Past Surgical History:  Procedure Laterality Date  . ADENOIDECTOMY    . I & D EXTREMITY Right 09/04/2018   Procedure: IRRIGATION AND DEBRIDEMENT OF  HAND;  Surgeon: Mack Hook, MD;  Location: [facility] OR;  Service: Orthopedics;  Laterality: Right;  . TYMPANOSTOMY TUBE PLACEMENT    . urinary surgery as child         Home Medications    Prior to Admission medications   Medication Sig Start Date End Date Taking? Authorizing Provider  gabapentin (NEURONTIN) 300 MG capsule Take 300 mg by mouth 3 (three) times daily.   Yes [provider]  hydrOXYzine (ATARAX/VISTARIL) 25 MG tablet Take 25 mg by mouth 3 (three) times daily as needed for  anxiety.    Yes [provider]  Valbenazine Tosylate (INGREZZA) 80 MG CAPS Take 80 mg by mouth daily.   Yes [provider]  VRAYLAR capsule Take 3 mg by mouth at bedtime. 12/29/19  Yes [provider]  asenapine (SAPHRIS) 5 MG SUBL 24 hr tablet Take 1 tablet (5 mg) under the tongue in the morning & 2 tablets (10 mg) under the tongue at bedtime: For mood control Patient taking differently: Place 5 mg under the tongue every morning.  03/26/17   Armandina Stammer I, NP  bacitracin ointment Apply topically 3 (three) times daily. 09/07/18   Kathlen Mody, MD  mupirocin ointment (BACTROBAN) 2 % Place 1 application into the nose 2 (two) times daily. 09/07/18   Kathlen Mody, MD  naproxen sodium (ALEVE) 220 MG tablet Take 440 mg by mouth daily as needed (pain or discomfort).    [provider]    Family History Family History  Problem Relation Age of Onset  . Healthy Mother   . Healthy Father     Social History Social History   Tobacco Use  . Smoking status: Current Every Day Smoker    Packs/day: 1.00    Years: 20.00    Pack years: 20.00    Types:  Cigarettes  . Smokeless tobacco: Never Used  Vaping Use  . Vaping Use: Never used  Substance Use Topics  . Alcohol use: Yes    Comment: occasionally; once q 1-2 wks  . Drug use: Yes    Types: Marijuana, Methamphetamines    Comment: uses meth regularly, also marijuana     Allergies   Patient has no known allergies.   Review of Systems Review of Systems   Physical Exam Triage Vital Signs ED Triage Vitals  Enc Vitals Group     BP 04/12/20 1532 122/78     Pulse Rate 04/12/20 1532 88     Resp 04/12/20 1532 (!) 21     Temp 04/12/20 1532 98.7 F (37.1 C)     Temp Source 04/12/20 1532 Oral     SpO2 04/12/20 1532 100 %     Weight --      Height --      Head Circumference --      Peak Flow --      Pain Score 04/12/20 1530 7     Pain Loc --      Pain Edu? --      Excl. in GC? --    No data  found.  Updated Vital Signs BP 122/78 (BP Location: Left Arm)   Pulse 88   Temp 98.7 F (37.1 C) (Oral)   Resp (!) 21   SpO2 100%   Visual Acuity Right Eye Distance:   Left Eye Distance:   Bilateral Distance:    Right Eye Near:   Left Eye Near:    Bilateral Near:     Physical Exam Vitals and nursing note reviewed.  Constitutional:      Appearance: Normal appearance.  Skin:    Comments: there are 2 wounds on the right lower extremity.  There is one overlying the medial malleoli, small eschar/scab and open wound with some surrounding cellulitis, minimally tender to palpation.  Larger wound more proximal on the medial aspect of the calf with eschar/scab with slight clear drainage.  No fluctuance.  Surrounding cellulitis is present.  Roughly 6 cm in diameter.  Pictured below  Left lower extremity.  There is a small eschared/scab on the left medial malleoli.  Minimal erythema surrounding this.  There are healing scars from previous insect bites on his lower extremities.  There is no spreading lymphangitis on either leg.   Neurological:     Mental Status: He is alert.        UC Treatments / Results  Labs (all labs ordered are listed, but only abnormal results are displayed) Labs Reviewed - No data to display  EKG   Radiology No results found.  Procedures Procedures (including critical care time)  Medications Ordered in UC Medications - No data to display  Initial Impression / Assessment and Plan / UC Course  I have reviewed the triage vital signs and the nursing notes.  Pertinent labs & imaging results that were available during my care of the patient were reviewed by me and considered in my medical decision making (see chart for details).     #Infected insect bites #Cellulitis Patient is a 39 year old presenting with infected insect bites.  Larger wound most concerning today.  No obvious fluctuance, believe it is significantly drained already.   Considering there is remaining surrounding cellulitis we will start him on antibiotic with close follow-up.  Started on Bactrim today.  Feel he may benefit from wound care follow-up to ensure healing.  Ambulatory  referral placed.  Discussed with patient he is unable to be seen in the next 3 days that he should return urgent care for reevaluation.  Discussed wound care.  Patient verbalized understanding plan of care. Final Clinical Impressions(s) / UC Diagnoses   Final diagnoses:  None   Discharge Instructions   None    ED Prescriptions    None     PDMP not reviewed this encounter.   Hermelinda Medicus, PA-C 04/12/20 1648

## 2020-04-19 ENCOUNTER — Institutional Professional Consult (permissible substitution): Payer: Medicaid Other | Admitting: Internal Medicine
